# Patient Record
Sex: Male | Born: 1945 | Race: White | Hispanic: No | Marital: Single | Smoking: Current every day smoker
Health system: Southern US, Community
[De-identification: ages and names within clinical notes are randomized; demographics above are authoritative.]

## PROBLEM LIST (undated history)

## (undated) DIAGNOSIS — I10 Essential (primary) hypertension: Secondary | ICD-10-CM

## (undated) DIAGNOSIS — E785 Hyperlipidemia, unspecified: Secondary | ICD-10-CM

## (undated) DIAGNOSIS — E119 Type 2 diabetes mellitus without complications: Secondary | ICD-10-CM

## (undated) DIAGNOSIS — I2699 Other pulmonary embolism without acute cor pulmonale: Secondary | ICD-10-CM

## (undated) HISTORY — PX: BYPASS GRAFT: SHX909

---

## 2000-01-24 ENCOUNTER — Encounter: Admission: RE | Admit: 2000-01-24 | Discharge: 2000-01-24 | Payer: Self-pay | Admitting: Family Medicine

## 2000-01-24 ENCOUNTER — Encounter: Payer: Self-pay | Admitting: Family Medicine

## 2001-06-21 ENCOUNTER — Emergency Department (HOSPITAL_COMMUNITY): Admission: EM | Admit: 2001-06-21 | Discharge: 2001-06-21 | Payer: Self-pay | Admitting: Emergency Medicine

## 2001-06-21 ENCOUNTER — Encounter: Payer: Self-pay | Admitting: Emergency Medicine

## 2002-09-26 ENCOUNTER — Encounter: Payer: Self-pay | Admitting: Emergency Medicine

## 2002-09-26 ENCOUNTER — Emergency Department (HOSPITAL_COMMUNITY): Admission: EM | Admit: 2002-09-26 | Discharge: 2002-09-26 | Payer: Self-pay | Admitting: Emergency Medicine

## 2002-10-01 ENCOUNTER — Encounter: Payer: Self-pay | Admitting: Urology

## 2002-10-01 ENCOUNTER — Ambulatory Visit (HOSPITAL_COMMUNITY): Admission: RE | Admit: 2002-10-01 | Discharge: 2002-10-01 | Payer: Self-pay | Admitting: Urology

## 2002-10-13 ENCOUNTER — Encounter: Payer: Self-pay | Admitting: Urology

## 2002-10-13 ENCOUNTER — Ambulatory Visit (HOSPITAL_COMMUNITY): Admission: RE | Admit: 2002-10-13 | Discharge: 2002-10-13 | Payer: Self-pay | Admitting: Urology

## 2002-10-14 ENCOUNTER — Ambulatory Visit (HOSPITAL_COMMUNITY): Admission: RE | Admit: 2002-10-14 | Discharge: 2002-10-14 | Payer: Self-pay | Admitting: *Deleted

## 2002-10-14 ENCOUNTER — Encounter: Payer: Self-pay | Admitting: Urology

## 2002-11-01 ENCOUNTER — Ambulatory Visit (HOSPITAL_COMMUNITY): Admission: RE | Admit: 2002-11-01 | Discharge: 2002-11-01 | Payer: Self-pay | Admitting: Gastroenterology

## 2002-11-19 ENCOUNTER — Ambulatory Visit (HOSPITAL_COMMUNITY): Admission: RE | Admit: 2002-11-19 | Discharge: 2002-11-19 | Payer: Self-pay | Admitting: Urology

## 2002-11-19 ENCOUNTER — Encounter: Payer: Self-pay | Admitting: Urology

## 2005-09-13 ENCOUNTER — Ambulatory Visit (HOSPITAL_COMMUNITY): Admission: RE | Admit: 2005-09-13 | Discharge: 2005-09-13 | Payer: Self-pay | Admitting: Gastroenterology

## 2011-02-21 ENCOUNTER — Encounter (HOSPITAL_COMMUNITY)
Admission: RE | Admit: 2011-02-21 | Discharge: 2011-02-21 | Disposition: A | Discharge status: Home / Self Care | Payer: Medicare Other | Source: Ambulatory Visit | Attending: Internal Medicine | Admitting: Internal Medicine

## 2011-02-21 DIAGNOSIS — Z5189 Encounter for other specified aftercare: Secondary | ICD-10-CM | POA: Insufficient documentation

## 2011-02-22 NOTE — Progress Notes (Signed)
During orientation advised patient on arrival and appointment times what to wear, what to do before, during and after exercise. Reviewed attendance and class policy. Talked about inclement weather and class consultation policy. Pt is scheduled to start Cardiac Rehab on 02/25/11 at 11 am. Pt was advised to come to class 5 minutes before class starts. He was also given instructions on meeting with the dietician and attending the Family Structure classes. Pt is eager to get started.

## 2011-02-22 NOTE — Patient Instructions (Signed)
Pt has finished orientation and is scheduled to start CR on 02/25/11 at 11 am. Pt has been instructed to arrive to class 15 minutes early for scheduled class. Pt has been instructed to wear comfortable clothing and shoes with rubber soles. Pt has been told to take their medications 1 hour prior to coming to class.  If the patient is not going to attend class, he/she has been instructed to call.

## 2011-02-25 ENCOUNTER — Encounter (HOSPITAL_COMMUNITY)
Admission: RE | Admit: 2011-02-25 | Discharge: 2011-02-25 | Disposition: A | Discharge status: Home / Self Care | Payer: Medicare Other | Source: Ambulatory Visit | Attending: Internal Medicine | Admitting: Internal Medicine

## 2011-02-27 ENCOUNTER — Encounter (HOSPITAL_COMMUNITY)
Admission: RE | Admit: 2011-02-27 | Discharge: 2011-02-27 | Disposition: A | Discharge status: Home / Self Care | Payer: Medicare Other | Source: Ambulatory Visit | Attending: Internal Medicine | Admitting: Internal Medicine

## 2011-03-01 ENCOUNTER — Encounter (HOSPITAL_COMMUNITY)
Admission: RE | Admit: 2011-03-01 | Discharge: 2011-03-01 | Disposition: A | Discharge status: Home / Self Care | Payer: Medicare Other | Source: Ambulatory Visit | Attending: Internal Medicine | Admitting: Internal Medicine

## 2011-03-04 ENCOUNTER — Encounter (HOSPITAL_COMMUNITY): Payer: Medicare Other

## 2011-03-06 ENCOUNTER — Encounter (HOSPITAL_COMMUNITY)
Admission: RE | Admit: 2011-03-06 | Discharge: 2011-03-06 | Disposition: A | Discharge status: Home / Self Care | Payer: Medicare Other | Source: Ambulatory Visit | Attending: Internal Medicine | Admitting: Internal Medicine

## 2011-03-08 ENCOUNTER — Encounter (HOSPITAL_COMMUNITY)
Admission: RE | Admit: 2011-03-08 | Discharge: 2011-03-08 | Disposition: A | Discharge status: Home / Self Care | Payer: Medicare Other | Source: Ambulatory Visit | Attending: Internal Medicine | Admitting: Internal Medicine

## 2011-03-11 ENCOUNTER — Encounter (HOSPITAL_COMMUNITY): Payer: Medicare Other

## 2011-03-13 ENCOUNTER — Encounter (HOSPITAL_COMMUNITY)
Admission: RE | Admit: 2011-03-13 | Discharge: 2011-03-13 | Disposition: A | Discharge status: Home / Self Care | Payer: Medicare Other | Source: Ambulatory Visit | Attending: Internal Medicine | Admitting: Internal Medicine

## 2011-03-15 ENCOUNTER — Encounter (HOSPITAL_COMMUNITY): Payer: Medicare Other

## 2011-03-18 ENCOUNTER — Encounter (HOSPITAL_COMMUNITY)
Admission: RE | Admit: 2011-03-18 | Discharge: 2011-03-18 | Disposition: A | Discharge status: Home / Self Care | Payer: Medicare Other | Source: Ambulatory Visit | Attending: Internal Medicine | Admitting: Internal Medicine

## 2011-03-20 ENCOUNTER — Encounter (HOSPITAL_COMMUNITY)
Admission: RE | Admit: 2011-03-20 | Discharge: 2011-03-20 | Disposition: A | Discharge status: Home / Self Care | Payer: Medicare Other | Source: Ambulatory Visit | Attending: Internal Medicine | Admitting: Internal Medicine

## 2011-03-22 ENCOUNTER — Encounter (HOSPITAL_COMMUNITY): Payer: Medicare Other

## 2011-03-25 ENCOUNTER — Encounter (HOSPITAL_COMMUNITY)
Admission: RE | Admit: 2011-03-25 | Discharge: 2011-03-25 | Disposition: A | Discharge status: Home / Self Care | Payer: Medicare Other | Source: Ambulatory Visit | Attending: Internal Medicine | Admitting: Internal Medicine

## 2011-03-27 ENCOUNTER — Encounter (HOSPITAL_COMMUNITY)
Admission: RE | Admit: 2011-03-27 | Discharge: 2011-03-27 | Disposition: A | Discharge status: Home / Self Care | Payer: Medicare Other | Source: Ambulatory Visit | Attending: Internal Medicine | Admitting: Internal Medicine

## 2011-03-29 ENCOUNTER — Encounter (HOSPITAL_COMMUNITY): Payer: Medicare Other

## 2011-03-29 NOTE — Progress Notes (Signed)
Cardiac Rehabilitation Program Progress Report   Orientation:  02/21/2011 Graduate Date:  tbd Discharge Date:  tbd # of sessions completed: 3  Cardiologist: Daryel November Family MD:  Dr Keturah Shavers Time:  11:00  A.  Exercise Program:  Tolerates exercise @ 2.4 METS for 15 minutes  B.  Mental Health:  Good mental attitude  C.  Education/Instruction/Skills  Knows THR for exercise and Uses Perceived Exertion Scale and/or Dyspnea Scale  Uses Perceived Exertion Scale and/or Dyspnea Scale  D.  Nutrition/Weight Control/Body Composition:  Adherence to prescribed nutrition program: good   *This section completed by Mickle Plumb, Andres Shad, RD, LDN, CDE  E.  Blood Lipids    No results found for this basename: CHOL     No results found for this basename: TRIG     No results found for this basename: HDL     No results found for this basename: CHOLHDL     No results found for this basename: LDLDIRECT      F.  Lifestyle Changes:  Making positive lifestyle changes  G.  Symptoms noted with exercise:  Asymptomatic  Report Completed By:  Angelica Pou   Comments: This is patients 1st week report. He achieved a peak mets of 2.4. His resting HR is 87 and his resting BP is 122/60. His peak HR is 111 and his peak BP is 138/70. He is motivated to exercise. A halfway report will follow.

## 2011-04-01 ENCOUNTER — Encounter (HOSPITAL_COMMUNITY)
Admission: RE | Admit: 2011-04-01 | Discharge: 2011-04-01 | Disposition: A | Discharge status: Home / Self Care | Payer: Medicare Other | Source: Ambulatory Visit | Attending: Internal Medicine | Admitting: Internal Medicine

## 2011-04-03 ENCOUNTER — Encounter (HOSPITAL_COMMUNITY)
Admission: RE | Admit: 2011-04-03 | Discharge: 2011-04-03 | Disposition: A | Discharge status: Home / Self Care | Payer: Medicare Other | Source: Ambulatory Visit | Attending: Internal Medicine | Admitting: Internal Medicine

## 2011-04-05 ENCOUNTER — Encounter (HOSPITAL_COMMUNITY): Payer: Medicare Other

## 2011-04-08 ENCOUNTER — Encounter (HOSPITAL_COMMUNITY)
Admission: RE | Admit: 2011-04-08 | Discharge: 2011-04-08 | Disposition: A | Discharge status: Home / Self Care | Payer: Medicare Other | Source: Ambulatory Visit | Attending: Internal Medicine | Admitting: Internal Medicine

## 2011-04-10 ENCOUNTER — Encounter (HOSPITAL_COMMUNITY)
Admission: RE | Admit: 2011-04-10 | Discharge: 2011-04-10 | Disposition: A | Discharge status: Home / Self Care | Payer: Medicare Other | Source: Ambulatory Visit | Attending: Internal Medicine | Admitting: Internal Medicine

## 2011-04-12 ENCOUNTER — Encounter (HOSPITAL_COMMUNITY)
Admission: RE | Admit: 2011-04-12 | Discharge: 2011-04-12 | Disposition: A | Discharge status: Home / Self Care | Payer: Medicare Other | Source: Ambulatory Visit | Attending: Internal Medicine | Admitting: Internal Medicine

## 2011-04-12 DIAGNOSIS — Z951 Presence of aortocoronary bypass graft: Secondary | ICD-10-CM | POA: Insufficient documentation

## 2011-04-12 DIAGNOSIS — Z5189 Encounter for other specified aftercare: Secondary | ICD-10-CM | POA: Insufficient documentation

## 2011-04-12 DIAGNOSIS — I2510 Atherosclerotic heart disease of native coronary artery without angina pectoris: Secondary | ICD-10-CM | POA: Insufficient documentation

## 2011-04-15 ENCOUNTER — Encounter (HOSPITAL_COMMUNITY)
Admission: RE | Admit: 2011-04-15 | Discharge: 2011-04-15 | Disposition: A | Discharge status: Home / Self Care | Payer: Medicare Other | Source: Ambulatory Visit | Attending: Internal Medicine | Admitting: Internal Medicine

## 2011-04-17 ENCOUNTER — Encounter (HOSPITAL_COMMUNITY)
Admission: RE | Admit: 2011-04-17 | Discharge: 2011-04-17 | Disposition: A | Discharge status: Home / Self Care | Payer: Medicare Other | Source: Ambulatory Visit | Attending: Internal Medicine | Admitting: Internal Medicine

## 2011-04-19 ENCOUNTER — Encounter (HOSPITAL_COMMUNITY): Payer: Medicare Other

## 2011-04-22 ENCOUNTER — Encounter (HOSPITAL_COMMUNITY)
Admission: RE | Admit: 2011-04-22 | Discharge: 2011-04-22 | Disposition: A | Discharge status: Home / Self Care | Payer: Medicare Other | Source: Ambulatory Visit | Attending: Internal Medicine | Admitting: Internal Medicine

## 2011-04-22 NOTE — Progress Notes (Signed)
Cardiac Rehabilitation Program Progress Report   Orientation:  02/21/2011 Graduate Date:  tbd Discharge Date:  tbd # of sessions completed: 18  Cardiologist: Daryel November Family MD:  Dr. Keturah Shavers Time:  09:30  A.  Exercise Program:  Tolerates exercise @ 3.3 METS for 15 minutes  B.  Mental Health:  Good mental attitude  C.  Education/Instruction/Skills  Knows THR for exercise and Uses Perceived Exertion Scale and/or Dyspnea Scale  Uses Perceived Exertion Scale and/or Dyspnea Scale  D.  Nutrition/Weight Control/Body Composition:  Adherence to prescribed nutrition program: good   *This section completed by Mickle Plumb, Andres Shad, RD, LDN, CDE  E.  Blood Lipids    No results found for this basename: CHOL     No results found for this basename: TRIG     No results found for this basename: HDL     No results found for this basename: CHOLHDL     No results found for this basename: LDLDIRECT      F.  Lifestyle Changes:  Making positive lifestyle changes  G.  Symptoms noted with exercise:  Asymptomatic  Report Completed By:  Angelica Pou   Comments:  This is patients halfway report . He has achieved a peak mets of 3.3. His resting HR is 91 and his resting BP is 108/62, His peak HR is 125 and his peak BP is 110/62. He is very motivated to exercise. A Graduation report will follow at his 36th session.

## 2011-04-24 ENCOUNTER — Encounter (HOSPITAL_COMMUNITY)
Admission: RE | Admit: 2011-04-24 | Discharge: 2011-04-24 | Disposition: A | Discharge status: Home / Self Care | Payer: Medicare Other | Source: Ambulatory Visit | Attending: Internal Medicine | Admitting: Internal Medicine

## 2011-04-26 ENCOUNTER — Encounter (HOSPITAL_COMMUNITY)
Admission: RE | Admit: 2011-04-26 | Discharge: 2011-04-26 | Disposition: A | Discharge status: Home / Self Care | Payer: Medicare Other | Source: Ambulatory Visit | Attending: Internal Medicine | Admitting: Internal Medicine

## 2011-04-29 ENCOUNTER — Encounter (HOSPITAL_COMMUNITY)
Admission: RE | Admit: 2011-04-29 | Discharge: 2011-04-29 | Disposition: A | Discharge status: Home / Self Care | Payer: Medicare Other | Source: Ambulatory Visit | Attending: Internal Medicine | Admitting: Internal Medicine

## 2011-05-01 ENCOUNTER — Encounter (HOSPITAL_COMMUNITY)
Admission: RE | Admit: 2011-05-01 | Discharge: 2011-05-01 | Disposition: A | Discharge status: Home / Self Care | Payer: Medicare Other | Source: Ambulatory Visit | Attending: Internal Medicine | Admitting: Internal Medicine

## 2011-05-03 ENCOUNTER — Encounter (HOSPITAL_COMMUNITY)
Admission: RE | Admit: 2011-05-03 | Discharge: 2011-05-03 | Disposition: A | Discharge status: Home / Self Care | Payer: Medicare Other | Source: Ambulatory Visit | Attending: Internal Medicine | Admitting: Internal Medicine

## 2011-05-06 ENCOUNTER — Encounter (HOSPITAL_COMMUNITY)
Admission: RE | Admit: 2011-05-06 | Discharge: 2011-05-06 | Disposition: A | Discharge status: Home / Self Care | Payer: Medicare Other | Source: Ambulatory Visit | Attending: Internal Medicine | Admitting: Internal Medicine

## 2011-05-08 ENCOUNTER — Encounter (HOSPITAL_COMMUNITY)
Admission: RE | Admit: 2011-05-08 | Discharge: 2011-05-08 | Disposition: A | Discharge status: Home / Self Care | Payer: Medicare Other | Source: Ambulatory Visit | Attending: Internal Medicine | Admitting: Internal Medicine

## 2011-05-10 ENCOUNTER — Encounter (HOSPITAL_COMMUNITY)
Admission: RE | Admit: 2011-05-10 | Discharge: 2011-05-10 | Disposition: A | Discharge status: Home / Self Care | Payer: Medicare Other | Source: Ambulatory Visit | Attending: Internal Medicine | Admitting: Internal Medicine

## 2011-05-10 DIAGNOSIS — I2510 Atherosclerotic heart disease of native coronary artery without angina pectoris: Secondary | ICD-10-CM | POA: Insufficient documentation

## 2011-05-10 DIAGNOSIS — Z951 Presence of aortocoronary bypass graft: Secondary | ICD-10-CM | POA: Insufficient documentation

## 2011-05-10 DIAGNOSIS — Z5189 Encounter for other specified aftercare: Secondary | ICD-10-CM | POA: Insufficient documentation

## 2011-05-13 ENCOUNTER — Encounter (HOSPITAL_COMMUNITY)
Admission: RE | Admit: 2011-05-13 | Discharge: 2011-05-13 | Disposition: A | Discharge status: Home / Self Care | Payer: Medicare Other | Source: Ambulatory Visit | Attending: Internal Medicine | Admitting: Internal Medicine

## 2011-05-15 ENCOUNTER — Encounter (HOSPITAL_COMMUNITY)
Admission: RE | Admit: 2011-05-15 | Discharge: 2011-05-15 | Disposition: A | Discharge status: Home / Self Care | Payer: Medicare Other | Source: Ambulatory Visit | Attending: Internal Medicine | Admitting: Internal Medicine

## 2011-05-17 ENCOUNTER — Encounter (HOSPITAL_COMMUNITY)
Admission: RE | Admit: 2011-05-17 | Discharge: 2011-05-17 | Disposition: A | Discharge status: Home / Self Care | Payer: Medicare Other | Source: Ambulatory Visit | Attending: Internal Medicine | Admitting: Internal Medicine

## 2011-05-20 ENCOUNTER — Encounter (HOSPITAL_COMMUNITY): Payer: Medicare Other

## 2011-05-22 ENCOUNTER — Encounter (HOSPITAL_COMMUNITY)
Admission: RE | Admit: 2011-05-22 | Discharge: 2011-05-22 | Disposition: A | Discharge status: Home / Self Care | Payer: Medicare Other | Source: Ambulatory Visit | Attending: Internal Medicine | Admitting: Internal Medicine

## 2011-05-24 ENCOUNTER — Encounter (HOSPITAL_COMMUNITY)
Admission: RE | Admit: 2011-05-24 | Discharge: 2011-05-24 | Disposition: A | Discharge status: Home / Self Care | Payer: Medicare Other | Source: Ambulatory Visit | Attending: Internal Medicine | Admitting: Internal Medicine

## 2011-05-24 DIAGNOSIS — I25810 Atherosclerosis of coronary artery bypass graft(s) without angina pectoris: Secondary | ICD-10-CM

## 2011-05-27 ENCOUNTER — Encounter (HOSPITAL_COMMUNITY)
Admission: RE | Admit: 2011-05-27 | Discharge: 2011-05-27 | Disposition: A | Discharge status: Home / Self Care | Payer: Medicare Other | Source: Ambulatory Visit | Attending: Internal Medicine | Admitting: Internal Medicine

## 2011-05-29 ENCOUNTER — Encounter (HOSPITAL_COMMUNITY)
Admission: RE | Admit: 2011-05-29 | Discharge: 2011-05-29 | Disposition: A | Discharge status: Home / Self Care | Payer: Medicare Other | Source: Ambulatory Visit | Attending: Internal Medicine | Admitting: Internal Medicine

## 2011-05-31 ENCOUNTER — Encounter (HOSPITAL_COMMUNITY)
Admission: RE | Admit: 2011-05-31 | Discharge: 2011-05-31 | Disposition: A | Discharge status: Home / Self Care | Payer: Medicare Other | Source: Ambulatory Visit | Attending: Internal Medicine | Admitting: Internal Medicine

## 2011-06-03 ENCOUNTER — Encounter (HOSPITAL_COMMUNITY)
Admission: RE | Admit: 2011-06-03 | Discharge: 2011-06-03 | Disposition: A | Discharge status: Home / Self Care | Payer: Medicare Other | Source: Ambulatory Visit | Attending: Internal Medicine | Admitting: Internal Medicine

## 2011-06-05 NOTE — Progress Notes (Signed)
Cardiac Rehabilitation Program Outcomes Report   Orientation:  02/21/11 Graduate Date:  06/05/2011 Discharge Date:  06/05/2011 # of sessions completed: 36  Cardiologist: Daryel November Family MD:  Dr. Burnette([place]) Class Time:  11:00  A.  Exercise Program:  Tolerates exercise @ 3.1` METS for 15 minutes and Discharged to home exercise program.  Anticipated compliance:  excellent  B.  Mental Health:  Good mental attitude  C.  Education/Instruction/Skills  Knows THR for exercise, Uses Perceived Exertion Scale and/or Dyspnea Scale and Attended all education classes  Uses Perceived Exertion Scale and/or Dyspnea Scale  D.  Nutrition/Weight Control/Body Composition:  Adherence to prescribed nutrition program: good    E.  Blood Lipids    No results found for this basename: CHOL, HDL, LDLCALC, LDLDIRECT, TRIG, CHOLHDL    F.  Lifestyle Changes:  Making positive lifestyle changes  G.  Symptoms noted with exercise:  Asymptomatic  Report Completed By:  Lelon Huh. Tika Hannis RN  Comments:  Mr Cravens has progressed nicely to 30 min off aerobic exercise @ Max METS level of 3.1.miles and 10 min of strength and flexibility exercises. All patients vital signs are normal. Patient has met with dietician. DC instructions have been discussed in detail and patient verbalized understanding. Patient plans to join a local gym for follow up exercise. Cardiac rehab staff will be contacting patients at 1 month, 6 month and at 1 year for follow-up Patient had No C/O pain or any other abnormal symptoms while in rehab.

## 2011-06-07 ENCOUNTER — Encounter (HOSPITAL_COMMUNITY)
Admission: RE | Admit: 2011-06-07 | Discharge: 2011-06-07 | Disposition: A | Discharge status: Home / Self Care | Payer: Medicare Other | Source: Ambulatory Visit | Attending: Internal Medicine | Admitting: Internal Medicine

## 2011-06-07 ENCOUNTER — Ambulatory Visit (HOSPITAL_COMMUNITY): Payer: BC Managed Care – PPO

## 2012-02-04 ENCOUNTER — Ambulatory Visit (HOSPITAL_COMMUNITY)
Admission: RE | Admit: 2012-02-04 | Discharge: 2012-02-04 | Disposition: A | Discharge status: Home / Self Care | Payer: Non-veteran care | Source: Ambulatory Visit | Attending: Nurse Practitioner | Admitting: Nurse Practitioner

## 2012-02-04 DIAGNOSIS — M549 Dorsalgia, unspecified: Secondary | ICD-10-CM | POA: Insufficient documentation

## 2012-02-04 DIAGNOSIS — M2560 Stiffness of unspecified joint, not elsewhere classified: Secondary | ICD-10-CM | POA: Insufficient documentation

## 2012-02-04 DIAGNOSIS — R262 Difficulty in walking, not elsewhere classified: Secondary | ICD-10-CM | POA: Insufficient documentation

## 2012-02-04 DIAGNOSIS — IMO0001 Reserved for inherently not codable concepts without codable children: Secondary | ICD-10-CM | POA: Insufficient documentation

## 2012-02-04 NOTE — Evaluation (Signed)
Physical Therapy Evaluation  Patient Details  Name: Alex Salazar MRN: [id number] Date of Birth: [birth year]  Today's Date: 02/04/2012 Time: 1107-1209 PT Time Calculation (min): 62 min  Visit#: 1  of 11   Re-eval: 03/05/12 Assessment Diagnosis: fatigue, back/leg pain Surgical Date: 01/09/11  [facility]ization: [facility]  Authorization Time Period: 01/21/12 - 04/20/2012  Authorization Visit#: 1  of 11    Past Medical History: No past medical history on file. Past Surgical History: No past surgical history on file.  Subjective Symptoms/Limitations Symptoms: Alex Salazar states that if he stands at the kitchen sink for more than five minutes he has pain on the right side of his back.  If he does not sit down the pain will go across to the L side of his back.  He states that both of his legs hurt but he  has been told that this is due to neuropathy.  The patient states he periodically has pain that radiate down into his left leg.  Sometimes the pain only goes to the upper thigh other times is goes down as far as his foot.  He had B blood clots in July.  His main concern at this tme is his fatigue.  He states he tries to walk to combat the fatigue but then his back and legs start to hurt so he quits.  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 How long can you sit comfortably?: sitting is no problem How long can you stand comfortably?: less than five minutes How long can you walk comfortably?: less than five minutes. Pain Assessment Currently in Pain?: No/denies Pain Score: 0-No pain (leg pain 10/10; back pain will get to a 7/10) Pain Location: Back Pain Orientation: Lower;Right;Left Pain Type: Chronic pain Pain Radiating Towards: L leg  Pain Onset: More than a month ago Pain Relieving Factors: rest Effect of Pain on Daily Activities:  activitiy]       Assessment RLE Strength Right Hip Flexion: 5/5 Right Hip Extension: 5/5 Right Hip ABduction: 5/5 Right Hip ADduction: 5/5 Right Knee Flexion: 5/5 Right Knee Extension: 5/5 Right Ankle Dorsiflexion: 4/5 LLE Strength Left Hip Flexion: 5/5 Left Hip Extension: 5/5 Left Hip ABduction: 5/5 Left Hip ADduction: 5/5 Left Knee Flexion: 5/5 Left Knee Extension: 5/5 Left Ankle Dorsiflexion:  (4+/5) Lumbar AROM Lumbar Flexion: decreased 20% reps no change. Lumbar Extension: decreased 80% reps no change Lumbar - Right Side Bend: decreased 60%. Lumbar - Left Side Bend: decreased 50% no change  Lumbar - Right Rotation: decreased 25% Lumbar - Left Rotation: decreased 50%  Exercise/Treatments     Stretches Active Hamstring Stretch: 3 reps;30 seconds Single Knee to Chest Stretch: 3 reps;30 seconds Lower Trunk Rotation: 5 reps Standing Side Bend: 5 reps Standing Other Standing Lumbar Exercises: R/L SB x 5 Seated Other Seated Lumbar Exercises: Toe/heel squeezes x 10 Supine Ab Set: 5 reps Other Supine Lumbar Exercises: pelvic floor x 10     Physical Therapy Assessment and Plan PT Assessment and Plan Clinical Impression Statement: Pt with decreased core strength, stiffness of thoracic and lumbar mm who c/o of LBP and L radicular pain who will benefit from skilled PT to improve pain, function and quality of life.  Pt will benefit from skilled therapeutic intervention in order to improve on the following deficits: Abnormal gait;Decreased activity tolerance;Pain;Impaired flexibility;Increased fascial restricitons;Decreased mobility Rehab Potential: Good PT  Frequency: Min 2X/week PT Duration:  (5 weeks) PT Treatment/Interventions: Gait training;Therapeutic activities;Modalities;Patient/family education PT Plan: Pt may begin pelvic traction for stretching and reducing radicular sx; begin gait trainer to normalize gt; add clam, bridge, bend knee raise and SLR for improved  stabilzation    Goals Home Exercise Program Pt will Perform Home Exercise Program: Independently PT Short Term Goals Time to Complete Short Term Goals: 2 weeks PT Short Term Goal 1: Pain no greater than a 5 80% of the day PT Short Term Goal 2: Pt to be able to walk for 15 minutes without increased pain PT Short Term Goal 3: Pt ROM improved by 25% PT Long Term Goals Time to Complete Long Term Goals: 4 weeks PT Long Term Goal 1: Pt to states L radicular pain is not going past his knee PT Long Term Goal 2: Pt pain to be no greater than a 3 80% of the day PT Long Term Goal 2 - Progress: Progressing toward goal Long Term Goal 3: Pt ROM improved 50% to allow pt to pick an item off the floor Long Term Goal 4: PT to be able to walk for 30 minutes without pain.  Problem List Patient Active Problem List  Diagnosis  . Difficulty in walking  . Stiffness of joints, not elsewhere classified, multiple sites    PT - End of Session Activity Tolerance: Patient tolerated treatment well General Behavior During Session: [facility] for tasks performed Cognition: [facility] for tasks performed PT Plan of Care PT Home Exercise Plan: given Consulted and Agree with Plan of Care: Patient  GP    RUSSELL,CINDY 02/04/2012, 1:55 PM  Physician Documentation Your signature is required to indicate approval of the treatment plan as stated above.  Please sign and either send electronically or make a copy of this report for your files and return this physician signed original.   Please mark one 1.__approve of plan  2. ___approve of plan with the following conditions.   ______________________________                                                          _____________________ Physician Signature                                                                                                             Date

## 2012-02-11 ENCOUNTER — Ambulatory Visit (HOSPITAL_COMMUNITY)
Admission: RE | Admit: 2012-02-11 | Discharge: 2012-02-11 | Disposition: A | Discharge status: Home / Self Care | Payer: Non-veteran care | Source: Ambulatory Visit | Attending: Nurse Practitioner | Admitting: Nurse Practitioner

## 2012-02-11 DIAGNOSIS — M549 Dorsalgia, unspecified: Secondary | ICD-10-CM | POA: Insufficient documentation

## 2012-02-11 DIAGNOSIS — R262 Difficulty in walking, not elsewhere classified: Secondary | ICD-10-CM | POA: Insufficient documentation

## 2012-02-11 DIAGNOSIS — IMO0001 Reserved for inherently not codable concepts without codable children: Secondary | ICD-10-CM | POA: Insufficient documentation

## 2012-02-11 NOTE — Progress Notes (Signed)
Physical Therapy Treatment Patient Details  Name: Alex Salazar MRN: [id number] Date of Birth: 02-15-1946  Today's Date: 02/11/2012 Time: 0930-1030 PT Time Calculation (min): 60 min  Visit#: 2  of 11   Re-eval: 03/05/12 Charges: Therex x 25' Mechanical traction x 17'  Authorization: [facility]  Authorization Time Period: 01/21/12 - 04/20/2012  Authorization Visit#: 2  of 11    Subjective: Symptoms/Limitations Symptoms: Pt states that he has been doing the exercises that he could remember at home. He lost his sheet. Pain Assessment Currently in Pain?: Yes Pain Score: 10-Worst pain ever Pain Location: Leg Pain Orientation: Left;Anterior Pain Radiating Towards: L foot   Exercise/Treatments Stretches Passive Hamstring Stretch: 3 reps;30 seconds;Limitations Passive Hamstring Stretch Limitations: with rope Single Knee to Chest Stretch: 3 reps;30 seconds Lower Trunk Rotation: 5 reps Supine Ab Set: 10 reps;5 seconds Bent Knee Raise: 10 reps Bridge: 10 reps;5 seconds Straight Leg Raise: 10 reps  Modalities Modalities: Traction Traction Type of Traction: Lumbar Max (lbs): 80# Hold Time: Static Time: 36' (17')  Physical Therapy Assessment and Plan PT Assessment and Plan Clinical Impression Statement: Pt requires multimodal cueing to facilitated core control with supine stabilization exercises. Modified active HS stretch to passive with rope as pt was unable to feel stretch secondary to quad weakness. Began mechanical lumbar tx to decrease radicular sx. Pt reports no radicular sx at end of session. PT Duration:  (5 weeks) PT Plan: Begin gait trainer to normalize gt next session.     Problem List Patient Active Problem List  Diagnosis  . Difficulty in walking  . Stiffness of joints, not elsewhere classified, multiple sites    PT - End of Session Activity Tolerance: Patient tolerated treatment well General Behavior During Session: [facility] for tasks performed Cognition: [facility]  for tasks performed  Seth Bake, PTA 02/11/2012, 11:58 AM

## 2012-02-13 ENCOUNTER — Ambulatory Visit (HOSPITAL_COMMUNITY)
Admission: RE | Admit: 2012-02-13 | Discharge: 2012-02-13 | Disposition: A | Discharge status: Home / Self Care | Payer: Non-veteran care | Source: Ambulatory Visit | Attending: *Deleted | Admitting: *Deleted

## 2012-02-13 DIAGNOSIS — R262 Difficulty in walking, not elsewhere classified: Secondary | ICD-10-CM

## 2012-02-13 DIAGNOSIS — M2560 Stiffness of unspecified joint, not elsewhere classified: Secondary | ICD-10-CM

## 2012-02-13 NOTE — Progress Notes (Signed)
Physical Therapy Treatment Patient Details  Name: Alex Salazar MRN: [id number] Date of Birth: 1945/11/11  Today's Date: 02/13/2012 Time: 1026-1118 PT Time Calculation (min): 52 min  Visit#: 3  of 11   Re-eval: 03/05/12    Authorization: [facility]    Authorization Visit#: 3  of 11    Subjective: Symptoms/Limitations Symptoms: Pt states that he has not had a bad day since using the traction  Pain Assessment Currently in Pain?: Yes Pain Score:   1 Pain Location: Back Pain Orientation: Left    Exercise/Treatments      Stretches Active Hamstring Stretch: 3 reps;30 seconds Single Knee to Chest Stretch: 3 reps;30 seconds Lower Trunk Rotation: 5 reps Aerobic Tread Mill: Gt trainer LL 92 cm to normalize gait    Standing Other Standing Lumbar Exercises: heel and back against wall; head on towel B UE flex x 10   Supine Ab Set: 10 reps Bent Knee Raise: 10 reps Bridge: 10 reps Straight Leg Raise: 10 reps  Modalities Modalities: Traction Traction Type of Traction: Lumbar Max (lbs): 80#-static  Physical Therapy Assessment and Plan PT Assessment and Plan Clinical Impression Statement: Pt continues to need verbal cuing for proper stabilization.  Pt did well with traction.   PT Plan: begin prone exercises next visit.    Goals    Problem List Patient Active Problem List  Diagnosis  . Difficulty in walking  . Stiffness of joints, not elsewhere classified, multiple sites       GP    Di Jasmer,CINDY 02/13/2012, 12:42 PM

## 2012-02-18 ENCOUNTER — Ambulatory Visit (HOSPITAL_COMMUNITY)
Admission: RE | Admit: 2012-02-18 | Discharge: 2012-02-18 | Disposition: A | Discharge status: Home / Self Care | Payer: Non-veteran care | Source: Ambulatory Visit | Attending: *Deleted | Admitting: *Deleted

## 2012-02-18 DIAGNOSIS — R262 Difficulty in walking, not elsewhere classified: Secondary | ICD-10-CM

## 2012-02-18 DIAGNOSIS — M2560 Stiffness of unspecified joint, not elsewhere classified: Secondary | ICD-10-CM

## 2012-02-18 NOTE — Progress Notes (Signed)
Physical Therapy Treatment Patient Details  Name: Alex Salazar MRN: [id number] Date of Birth: 11/15/1945  Today's Date: 02/18/2012 Time: 1012-1117 PT Time Calculation (min): 65 min  Visit#: 4  of 11   Re-eval: 03/05/12    Authorization: [facility]  Authorization Time Period:    Authorization Visit#: 4  of 11    Subjective: Symptoms/Limitations Symptoms: Pt states that he feels better overall but he is unable to give a percentage to it.  Pt states that he is doing his exercises at home   Exercise/Treatments  Stretches Active Hamstring Stretch: 3 reps;30 seconds Single Knee to Chest Stretch: 3 reps;30 seconds Lower Trunk Rotation: 5 reps Aerobic Tread Mill: Gt trainer LL 90 cm Machines for Strengthening   Standing Scapular Retraction: Strengthening;10 reps;Theraband Theraband Level (Scapular Retraction): Level 3 (Green) Row: Strengthening;10 reps;Theraband Theraband Level (Row): Level 3 (Green) Shoulder Extension: Strengthening;10 reps;Theraband Theraband Level (Shoulder Extension): Level 3 (Green) Other Standing Lumbar Exercises: heel and back against wall; head on towel B UE flex x 10   Supine Bent Knee Raise: 10 reps Bridge: 10 reps Straight Leg Raise: 10 reps Prone  Straight Leg Raise: 10 reps Other Prone Lumbar Exercises: heel squeeze x 10 Quadruped    Modalities Modalities: Traction Traction Type of Traction: Lumbar Max (lbs): 80# static  Physical Therapy Assessment and Plan PT Assessment and Plan Clinical Impression Statement: Pt added postural and prone ex with verbal and manual cuing to keep proper stabilization.  PT Plan: begin standing postural UE flex ex next trreatment    Goals Home Exercise Program PT Goal: Perform Home Exercise Program - Progress: Met PT Short Term Goals PT Short Term Goal 1 - Progress: Progressing toward goal PT Short Term Goal 2 - Progress: Progressing toward goal PT Short Term Goal 3 - Progress: Met PT Long Term  Goals PT Long Term Goal 1 - Progress: Progressing toward goal PT Long Term Goal 2 - Progress: Progressing toward goal Long Term Goal 3 Progress: Progressing toward goal Long Term Goal 4 Progress: Progressing toward goal  Problem List Patient Active Problem List  Diagnosis  . Difficulty in walking  . Stiffness of joints, not elsewhere classified, multiple sites    PT - End of Session Activity Tolerance: Patient tolerated treatment well General Behavior During Session: [facility] for tasks performed Cognition: [facility] for tasks performed  GP    RUSSELL,CINDY 02/18/2012, 11:04 AM

## 2012-02-20 ENCOUNTER — Ambulatory Visit (HOSPITAL_COMMUNITY)
Admission: RE | Admit: 2012-02-20 | Discharge: 2012-02-20 | Disposition: A | Discharge status: Home / Self Care | Payer: Non-veteran care | Source: Ambulatory Visit | Attending: *Deleted | Admitting: *Deleted

## 2012-02-20 NOTE — Progress Notes (Signed)
Physical Therapy Treatment Patient Details  Name: Alex Salazar MRN: [id number] Date of Birth: [birth year]  Today's Date: 02/20/2012 Time: 1022-1124 PT Time Calculation (min): 62 min Visit#: 5  of 11   Re-eval: 03/05/12 Authorization: [facility]  Authorization Visit#: 5  of 11   Charges:  Traction 15', therex 35'  Subjective: Symptoms/Limitations Symptoms: Pt. reports still having some discomfort but overall doing better.     Exercise/Treatments Stretches Active Hamstring Stretch: 3 reps;30 seconds Single Knee to Chest Stretch: 3 reps;30 seconds Lower Trunk Rotation: 5 reps Aerobic Tread Mill: Gt trainer LL 90 cm 10' @ .50cyc/sec Standing Scapular Retraction: Strengthening;10 reps;Theraband Theraband Level (Scapular Retraction): Level 3 (Green) Row: Strengthening;10 reps;Theraband Theraband Level (Row): Level 3 (Green) Shoulder Extension: Strengthening;10 reps;Theraband Theraband Level (Shoulder Extension): Level 3 (Green) Other Standing Lumbar Exercises: heel and back against wall; head on towel B UE flex x 10 Supine Bent Knee Raise: 10 reps Bridge: 10 reps Straight Leg Raise: 10 reps    Modalities Modalities: Traction Traction Type of Traction: Lumbar Max (lbs): 90# static; 4 steps up  Hold Time: static Time: 15'  Physical Therapy Assessment and Plan PT Assessment and Plan Clinical Impression Statement: Pt able to complete all stretches with vc's; improved gait quality/even stride with VC's.  Increased max pull with traction to 90# today without difficulty. PT Plan: Continue per POC.     Problem List Patient Active Problem List  Diagnosis  . Difficulty in walking  . Stiffness of joints, not elsewhere classified, multiple sites    PT - End of Session Activity Tolerance: Patient tolerated treatment well General Behavior During Session: [facility] for tasks performed Cognition: [facility] for tasks performed   Alex Salazar, PTA/CLT 02/20/2012, 11:27 AM

## 2012-02-25 ENCOUNTER — Ambulatory Visit (HOSPITAL_COMMUNITY)
Admission: RE | Admit: 2012-02-25 | Discharge: 2012-02-25 | Disposition: A | Discharge status: Home / Self Care | Payer: Non-veteran care | Source: Ambulatory Visit | Attending: Nurse Practitioner | Admitting: Nurse Practitioner

## 2012-02-25 NOTE — Progress Notes (Signed)
Physical Therapy Treatment Patient Details  Name: Alex Salazar MRN: [id number] Date of Birth: 05/02/1945  Today's Date: 02/25/2012 Time: 1018-1110 PT Time Calculation (min): 52 min  Visit#: 6  of 11   Re-eval: 03/05/12 Charges: Therex x 10' Gait x 9' Mechanical tx x 17' Self care x 10'  Authorization: [facility]  Authorization Visit#: 6  of 11    Subjective: Symptoms/Limitations Symptoms: Pt states that his legs are feeling better. Pain Assessment Currently in Pain?: No/denies   Exercise/Treatments Aerobic Tread Mill: Gt trainer LL 90 cm 9' @ .50cyc/sec (Stopped secondary to B leg pain) Standing Scapular Retraction: Strengthening;10 reps;Theraband Theraband Level (Scapular Retraction): Level 3 (Green) Row: Strengthening;10 reps;Theraband Theraband Level (Row): Level 3 (Green) Shoulder Extension: Strengthening;10 reps;Theraband Theraband Level (Shoulder Extension): Level 3 (Green)   Modalities Modalities: Traction Traction Type of Traction: Lumbar Max (lbs): 90# static; 4 steps up  Hold Time: static Time: 17'  Physical Therapy Assessment and Plan PT Assessment and Plan Clinical Impression Statement: Pt requires multimodal cueing to improve posture with tband exercises and gait training. Pt also requires vc's to improve R step length and heel to toe pattern. Pt educated on cause of radicular sx and how mechanical tx works. Pt continues to have radicular sx when lying supine. These sx were still present but decreased after mechanical traction. PT Plan: Continue to progress per PT POC.     Problem List Patient Active Problem List  Diagnosis  . Difficulty in walking  . Stiffness of joints, not elsewhere classified, multiple sites    PT - End of Session Activity Tolerance: Patient tolerated treatment well General Behavior During Session: [facility] for tasks performed Cognition: [facility] for tasks performed  Seth Bake, PTA 02/25/2012, 11:31 AM

## 2012-02-27 ENCOUNTER — Ambulatory Visit (HOSPITAL_COMMUNITY)
Admission: RE | Admit: 2012-02-27 | Discharge: 2012-02-27 | Disposition: A | Discharge status: Home / Self Care | Payer: Non-veteran care | Source: Ambulatory Visit | Attending: *Deleted | Admitting: *Deleted

## 2012-02-27 DIAGNOSIS — R262 Difficulty in walking, not elsewhere classified: Secondary | ICD-10-CM

## 2012-02-27 DIAGNOSIS — M2560 Stiffness of unspecified joint, not elsewhere classified: Secondary | ICD-10-CM

## 2012-02-27 NOTE — Progress Notes (Signed)
Physical Therapy Treatment Patient Details  Name: Alex Salazar MRN: [id number] Date of Birth: April 20, 1945  Today's Date: 02/27/2012 Time: 1020-1112 PT Time Calculation (min): 52 min  Visit#: 7  of 11   Re-eval: 03/05/12    Authorization: [facility]  Authorization Visit#: 7  of 11    Subjective: Symptoms/Limitations Symptoms: Pt states that his legs give out but not all the time it comes and goes in bouts Pain Assessment Currently in Pain?: Yes Pain Score: 0-No pain   Exercise/Treatments  Stretches Active Hamstring Stretch: 3 reps;30 seconds Single Knee to Chest Stretch: 3 reps;30 seconds Lower Trunk Rotation: 5 reps Aerobic Tread Mill: Gt trainer LL 90 cm 10' @ .50cyc/sec Machines for Strengthening   Standing Scapular Retraction: Strengthening;10 reps;Theraband Theraband Level (Scapular Retraction): Level 3 (Green) Row: Strengthening;10 reps;Theraband Theraband Level (Row): Level 3 (Green) Shoulder Extension: Strengthening;10 reps;Theraband Theraband Level (Shoulder Extension): Level 3 (Green) Other Standing Lumbar Exercises: heel and back against wall; head on towel B UE flex x 10   Prone  Single Arm Raise: 10 reps;Limitations Single Arm Raises Limitations: elbows bent to about  45 degrees Straight Leg Raise: 10 reps Other Prone Lumbar Exercises: hip extension B x 10  Modalities Modalities: Traction Traction Type of Traction: Lumbar Max (lbs): Static 90#  Time: 15  Physical Therapy Assessment and Plan PT Assessment and Plan Clinical Impression Statement: Pt continues to have poor body awaress ie when lying down his hips are significantly offset to his shoulders and he feels that this is normal.  Pt given t-band ex to use at home.  cued on proper posture.  Added new prone exercises. Rehab Potential: Good PT Frequency: Min 2X/week PT Plan: Begin lunging standing activities next treatment.    Goals    Problem List Patient Active Problem List  Diagnosis  .  Difficulty in walking  . Stiffness of joints, not elsewhere classified, multiple sites    PT Plan of Care PT Home Exercise Plan: given  GP    RUSSELL,CINDY 02/27/2012, 12:30 PM

## 2012-03-03 ENCOUNTER — Ambulatory Visit (HOSPITAL_COMMUNITY): Payer: Non-veteran care | Admitting: Physical Therapy

## 2012-03-05 ENCOUNTER — Ambulatory Visit (HOSPITAL_COMMUNITY): Payer: BC Managed Care – PPO | Admitting: Physical Therapy

## 2012-03-10 ENCOUNTER — Ambulatory Visit (HOSPITAL_COMMUNITY): Payer: BC Managed Care – PPO | Admitting: *Deleted

## 2012-03-12 ENCOUNTER — Ambulatory Visit (HOSPITAL_COMMUNITY): Payer: Non-veteran care | Admitting: *Deleted

## 2012-03-12 ENCOUNTER — Ambulatory Visit (HOSPITAL_COMMUNITY)
Admission: RE | Admit: 2012-03-12 | Discharge: 2012-03-12 | Disposition: A | Discharge status: Home / Self Care | Payer: Non-veteran care | Source: Ambulatory Visit | Attending: Family Medicine | Admitting: Family Medicine

## 2012-03-12 DIAGNOSIS — R262 Difficulty in walking, not elsewhere classified: Secondary | ICD-10-CM | POA: Insufficient documentation

## 2012-03-12 DIAGNOSIS — IMO0001 Reserved for inherently not codable concepts without codable children: Secondary | ICD-10-CM | POA: Insufficient documentation

## 2012-03-12 DIAGNOSIS — M549 Dorsalgia, unspecified: Secondary | ICD-10-CM | POA: Insufficient documentation

## 2012-03-12 NOTE — Progress Notes (Signed)
Physical Therapy Treatment Patient Details  Name: Alex Salazar MRN: [id number] Date of Birth: 12/17/1945  Today's Date: 03/12/2012 Time: 1015-1105 PT Time Calculation (min): 50 min  Visit#: 8  of 11   Re-eval: 03/05/12 Charges: Gait x 10' Therex x 15' Mechanical traction x 17'  Authorization: [facility]  Authorization Visit#: 8  of 11    Subjective: Symptoms/Limitations Symptoms: Pt states that traction helps his back pain. Pain Assessment Currently in Pain?: Yes Pain Score:   4 Pain Location: Leg Pain Orientation: Anterior;Proximal;Right;Left   Exercise/Treatments Aerobic Tread Mill: Gt trainer LL 90 cm 10' @ .50cyc/sec Standing Heel Raises: 10 reps;Limitations Heel Raises Limitations: Toe raises x 10 Functional Squats: 10 reps Forward Lunge: 5 reps;Limitations Forward Lunge Limitations: Bilateral Other Standing Lumbar Exercises: Sit to stand x 10 w/o UE assistance  Modalities Modalities: Traction Traction Type of Traction: Lumbar Max (lbs): 90#  Hold Time: Static Time: 51'  Physical Therapy Assessment and Plan PT Assessment and Plan Clinical Impression Statement: Tx focus on improved leg strength and stability. Began functional squats, lunging and STS to improve LE functional strength. Continued mechanical lumbar traction to decrease pain. Traction did not change leg pain.  PT Plan: Continue to progress per PT POC. Assess other options for decreaseing leg pain.     Problem List Patient Active Problem List  Diagnosis  . Difficulty in walking  . Stiffness of joints, not elsewhere classified, multiple sites    PT - End of Session Activity Tolerance: Patient tolerated treatment well General Behavior During Session: [facility] for tasks performed Cognition: [facility] for tasks performed  Seth Bake, PTA 03/12/2012, 11:49 AM

## 2012-03-17 ENCOUNTER — Ambulatory Visit (HOSPITAL_COMMUNITY)
Admission: RE | Admit: 2012-03-17 | Discharge: 2012-03-17 | Disposition: A | Discharge status: Home / Self Care | Payer: Non-veteran care | Source: Ambulatory Visit | Attending: *Deleted | Admitting: *Deleted

## 2012-03-17 NOTE — Progress Notes (Signed)
Physical Therapy Treatment Patient Details  Name: DAYTONA RETANA MRN: [id number] Date of Birth: 1945/10/09  Today's Date: 03/17/2012 Time: 1013-1058 PT Time Calculation (min): 45 min  Visit#: 9  of 11   Re-eval: 03/05/12 Authorization: Medicare  Authorization Visit#: 9  of 11   Charges:  Manual 28', gait 14'  Subjective: Symptoms/Limitations Symptoms: Pt. reports his back feels much better but continues to have pain into his L quad mm; states it is worse with movement or lying with it outstretched.   Pain Assessment Currently in Pain?: Yes Pain Score:   4 Pain Location: Leg Pain Orientation: Left;Anterior Pain Type: Chronic pain   Exercise/Treatments Aerobic Tread Mill: Gt trainer LL 90 cm 10' @ .50cyc/sec  Gait following manual, working on increasing stride/atalgic gait reduction    Manual Therapy Manual Therapy: Other (comment) Myofascial Release: To Right Quad, ITB and hip region Other Manual Therapy: PROM for R LE and hip, focus on quad, ITB, hip flexion, hip IR/ER  Physical Therapy Assessment and Plan PT Assessment and Plan Clinical Impression Statement: Focused tx on decreasing pain/tightness in L LE.  Held TX due to no longer having LBP or related symptoms.  Extreme tightness in L quad, ITB and hip noted with MFR and PROM techniques.   Limited hip IR/ER ROM.  Overall decreased symptoms following manual techniques and improved gait quality following.   PT Plan: Re-eval/ update G-code next visit.     Problem List Patient Active Problem List  Diagnosis  . Difficulty in walking  . Stiffness of joints, not elsewhere classified, multiple sites    PT - End of Session Activity Tolerance: Patient tolerated treatment well General Behavior During Session: [facility] for tasks performed Cognition: [facility] for tasks performed   Lurena Nida, PTA/CLT 03/17/2012, 12:06 PM

## 2012-03-19 ENCOUNTER — Ambulatory Visit (HOSPITAL_COMMUNITY): Payer: Non-veteran care | Admitting: Physical Therapy

## 2012-03-24 ENCOUNTER — Ambulatory Visit (HOSPITAL_COMMUNITY)
Admission: RE | Admit: 2012-03-24 | Discharge: 2012-03-24 | Disposition: A | Discharge status: Home / Self Care | Payer: Non-veteran care | Source: Ambulatory Visit | Attending: *Deleted | Admitting: *Deleted

## 2012-03-24 DIAGNOSIS — R262 Difficulty in walking, not elsewhere classified: Secondary | ICD-10-CM

## 2012-03-24 DIAGNOSIS — M2560 Stiffness of unspecified joint, not elsewhere classified: Secondary | ICD-10-CM

## 2012-03-24 NOTE — Progress Notes (Signed)
Physical Therapy Treatment Patient Details  Name: Alex Salazar MRN: [id number] Date of Birth: 05/02/1945  Today's Date: 03/24/2012 Time: 4098-1191 PT Time Calculation (min): 45 min  Visit#: 10  of 11   Re-eval: 03/26/12    Authorization: [facility] 11 approved   Authorization Visit#: 10  of 11    Subjective: Symptoms/Limitations Symptoms: Pt states he is having most of his pain in his L quad mm; minimal pain in his back   Exercise/Treatments Active Hamstring Stretch: 3 reps;30 seconds Single Knee to Chest Stretch: 3 reps;30 seconds Lower Trunk Rotation: 5 reps Prone on Elbows Stretch: 2 reps;30 seconds Quad Stretch: 3 reps;30 seconds Aerobic Tread Mill: Gt trainer LL 90 x 6:00 Supine Bent Knee Raise: 10 reps Bridge: 10 reps Straight Leg Raise: 5 reps  Prone  Straight Leg Raise: 10 reps Other Prone Lumbar Exercises: B shoulder ext x 10  Manual Therapy Myofascial Release: To  Quad and iliopsoas  Physical Therapy Assessment and Plan PT Assessment and Plan Clinical Impression Statement: Spoke to pt about leg pain actually coming from his back;  Pt states the longer he is up the more his thigh hurts and it will progress to below his knee.  Worked on proper stabilization and ambulating without antalgic gait; Pt may want to consider using a cane to prevent antalgic gtl PT Plan: re eval for D/C next visit.    Goals    Problem List Patient Active Problem List  Diagnosis  . Difficulty in walking  . Stiffness of joints, not elsewhere classified, multiple sites    PT - End of Session Activity Tolerance: Patient tolerated treatment well General Behavior During Session: [facility] for tasks performed Cognition: [facility] for tasks performed  GP    Sandra Tellefsen,CINDY 03/24/2012, 12:18 PM

## 2012-03-26 ENCOUNTER — Ambulatory Visit (HOSPITAL_COMMUNITY)
Admission: RE | Admit: 2012-03-26 | Discharge: 2012-03-26 | Disposition: A | Discharge status: Home / Self Care | Payer: Non-veteran care | Source: Ambulatory Visit | Attending: *Deleted | Admitting: *Deleted

## 2012-03-26 NOTE — Progress Notes (Signed)
Physical Therapy Treatment Patient Details  Name: Alex Salazar MRN: [id number] Date of Birth: Mar 09, 1946  Today's Date: 03/26/2012 Time: 1020-1110 PT Time Calculation (min): 50 min  Visit#: 11  of 11   Re-eval: 03/26/12 Charges: Manual x 20' MMT x 1 ROMM x1  Authorization: [facility] 11 approved  Authorization Time Period:    Authorization Visit#: 11  of 11    Subjective: Symptoms/Limitations Symptoms: Pt only has pain with ambulation. Pain Assessment Currently in Pain?: Yes Pain Score: 0-No pain (Increases to 5/10 with ambulation) Pain Location: Leg Pain Orientation: Left;Anterior  Objective: RLE Strength Right Hip Flexion: 5/5 Right Hip Extension: 5/5 Right Hip ABduction: 5/5 Right Hip ADduction: 5/5 Right Knee Flexion: 5/5 Right Knee Extension: 5/5 Right Ankle Dorsiflexion: 5/5 LLE Strength Left Hip Flexion: 5/5 Left Hip Extension: 5/5 Left Hip ABduction: 5/5 Left Hip ADduction: 5/5 Left Knee Flexion: 5/5 Left Knee Extension: 5/5 Left Ankle Dorsiflexion: 5/5 Lumbar AROM Lumbar Flexion: decreased 10% reps no change. Lumbar Extension: decreased 20% Lumbar - Right Side Bend: decreased 50% Lumbar - Left Side Bend: decreased 50% Lumbar - Right Rotation: decreased 25% Lumbar - Left Rotation: decreased 25%  Exercise/Treatments Stretches Piriformis Stretch: 1 rep;60 seconds Standing Other Standing Lumbar Exercises: Thomas stretch 1x2'  Manual Therapy Other Manual Therapy: PROM for R LE and hip, focus on quad, ITB, hip flexion, hip IR/ER   Physical Therapy Assessment and Plan PT Assessment and Plan Clinical Impression Statement: Pt presents with improved ROM and strength but continues to be limited by pain. Pt states that pain has decreased 50% and he is pleased with his progress with therapy. Pt's flexibility continues to be limited. Hip stretches given for HEP. Pt is comfortable with D/C to HEP. PT Plan: Recommend D/C HEP.     Goals Home Exercise Program Pt  will Perform Home Exercise Program: Independently PT Short Term Goals Time to Complete Short Term Goals: 2 weeks PT Short Term Goal 1: Pain no greater than a 5 80% of the day PT Short Term Goal 1 - Progress: Met PT Short Term Goal 2: Pt to be able to walk for 15 minutes without increased pain PT Short Term Goal 2 - Progress: Not met PT Short Term Goal 3: Pt ROM improved by 25% PT Long Term Goals Time to Complete Long Term Goals: 4 weeks PT Long Term Goal 1: Pt to states L radicular pain is not going past his knee. PT Long Term Goal 1 - Progress: Met PT Long Term Goal 2: Pt pain to be no greater than a 3 80% of the day PT Long Term Goal 2 - Progress: Met Long Term Goal 3: Pt ROM improved 50% to allow pt to pick an item off the floor Long Term Goal 4: Pt to be able to walk for 30 minutes without pain. Long Term Goal 4 Progress: Not met  Problem List Patient Active Problem List  Diagnosis  . Difficulty in walking  . Stiffness of joints, not elsewhere classified, multiple sites    PT - End of Session Activity Tolerance: Patient tolerated treatment well General Behavior During Session: [facility] for tasks performed Cognition: [facility] for tasks performed  Seth Bake, PTA 03/26/2012, 11:35 AM

## 2012-03-31 ENCOUNTER — Ambulatory Visit (HOSPITAL_COMMUNITY): Payer: Non-veteran care | Admitting: *Deleted

## 2018-08-05 ENCOUNTER — Encounter (HOSPITAL_COMMUNITY): Payer: Self-pay | Admitting: Emergency Medicine

## 2018-08-05 ENCOUNTER — Emergency Department (HOSPITAL_COMMUNITY): Payer: Medicare Other

## 2018-08-05 ENCOUNTER — Other Ambulatory Visit: Payer: Self-pay

## 2018-08-05 ENCOUNTER — Inpatient Hospital Stay (HOSPITAL_COMMUNITY)
Admission: EM | Admit: 2018-08-05 | Discharge: 2018-08-06 | DRG: 065 | Disposition: A | Discharge status: Home / Self Care | Payer: Medicare Other | Attending: Family Medicine | Admitting: Family Medicine

## 2018-08-05 DIAGNOSIS — I2510 Atherosclerotic heart disease of native coronary artery without angina pectoris: Secondary | ICD-10-CM | POA: Diagnosis present

## 2018-08-05 DIAGNOSIS — Z888 Allergy status to other drugs, medicaments and biological substances status: Secondary | ICD-10-CM | POA: Diagnosis not present

## 2018-08-05 DIAGNOSIS — R739 Hyperglycemia, unspecified: Secondary | ICD-10-CM

## 2018-08-05 DIAGNOSIS — R262 Difficulty in walking, not elsewhere classified: Secondary | ICD-10-CM

## 2018-08-05 DIAGNOSIS — R200 Anesthesia of skin: Secondary | ICD-10-CM | POA: Diagnosis present

## 2018-08-05 DIAGNOSIS — I6381 Other cerebral infarction due to occlusion or stenosis of small artery: Secondary | ICD-10-CM | POA: Diagnosis present

## 2018-08-05 DIAGNOSIS — Z86711 Personal history of pulmonary embolism: Secondary | ICD-10-CM

## 2018-08-05 DIAGNOSIS — Z7951 Long term (current) use of inhaled steroids: Secondary | ICD-10-CM | POA: Diagnosis not present

## 2018-08-05 DIAGNOSIS — I639 Cerebral infarction, unspecified: Secondary | ICD-10-CM | POA: Diagnosis not present

## 2018-08-05 DIAGNOSIS — Z7902 Long term (current) use of antithrombotics/antiplatelets: Secondary | ICD-10-CM | POA: Diagnosis not present

## 2018-08-05 DIAGNOSIS — E1140 Type 2 diabetes mellitus with diabetic neuropathy, unspecified: Secondary | ICD-10-CM | POA: Diagnosis present

## 2018-08-05 DIAGNOSIS — Z7982 Long term (current) use of aspirin: Secondary | ICD-10-CM | POA: Diagnosis not present

## 2018-08-05 DIAGNOSIS — E1165 Type 2 diabetes mellitus with hyperglycemia: Secondary | ICD-10-CM | POA: Diagnosis present

## 2018-08-05 DIAGNOSIS — M19041 Primary osteoarthritis, right hand: Secondary | ICD-10-CM | POA: Diagnosis present

## 2018-08-05 DIAGNOSIS — G8194 Hemiplegia, unspecified affecting left nondominant side: Secondary | ICD-10-CM | POA: Diagnosis present

## 2018-08-05 DIAGNOSIS — Z880 Allergy status to penicillin: Secondary | ICD-10-CM | POA: Diagnosis not present

## 2018-08-05 DIAGNOSIS — Z79899 Other long term (current) drug therapy: Secondary | ICD-10-CM | POA: Diagnosis not present

## 2018-08-05 DIAGNOSIS — R29701 NIHSS score 1: Secondary | ICD-10-CM | POA: Diagnosis present

## 2018-08-05 DIAGNOSIS — F17210 Nicotine dependence, cigarettes, uncomplicated: Secondary | ICD-10-CM | POA: Diagnosis present

## 2018-08-05 DIAGNOSIS — Z20828 Contact with and (suspected) exposure to other viral communicable diseases: Secondary | ICD-10-CM | POA: Diagnosis present

## 2018-08-05 DIAGNOSIS — E119 Type 2 diabetes mellitus without complications: Secondary | ICD-10-CM | POA: Diagnosis not present

## 2018-08-05 DIAGNOSIS — Z7984 Long term (current) use of oral hypoglycemic drugs: Secondary | ICD-10-CM

## 2018-08-05 DIAGNOSIS — E785 Hyperlipidemia, unspecified: Secondary | ICD-10-CM | POA: Diagnosis present

## 2018-08-05 DIAGNOSIS — Z720 Tobacco use: Secondary | ICD-10-CM

## 2018-08-05 DIAGNOSIS — R29898 Other symptoms and signs involving the musculoskeletal system: Secondary | ICD-10-CM | POA: Diagnosis present

## 2018-08-05 DIAGNOSIS — I10 Essential (primary) hypertension: Secondary | ICD-10-CM | POA: Diagnosis present

## 2018-08-05 DIAGNOSIS — M19042 Primary osteoarthritis, left hand: Secondary | ICD-10-CM | POA: Diagnosis present

## 2018-08-05 HISTORY — DX: Other pulmonary embolism without acute cor pulmonale: I26.99

## 2018-08-05 HISTORY — DX: Type 2 diabetes mellitus without complications: E11.9

## 2018-08-05 HISTORY — DX: Hyperlipidemia, unspecified: E78.5

## 2018-08-05 HISTORY — DX: Essential (primary) hypertension: I10

## 2018-08-05 LAB — COMPREHENSIVE METABOLIC PANEL
ALT: 31 U/L (ref 0–44)
AST: 27 U/L (ref 15–41)
Albumin: 3.4 g/dL — ABNORMAL LOW (ref 3.5–5.0)
Alkaline Phosphatase: 76 U/L (ref 38–126)
Anion gap: 13 (ref 5–15)
BUN: 15 mg/dL (ref 8–23)
CO2: 25 mmol/L (ref 22–32)
Calcium: 9.5 mg/dL (ref 8.9–10.3)
Chloride: 99 mmol/L (ref 98–111)
Creatinine, Ser: 0.99 mg/dL (ref 0.61–1.24)
GFR calc Af Amer: >60 mL/min (ref >60)
GFR calc non Af Amer: >60 mL/min (ref >60)
Glucose, Bld: 275 mg/dL — ABNORMAL HIGH (ref 70–99)
Potassium: 3.8 mmol/L (ref 3.5–5.1)
Sodium: 137 mmol/L (ref 135–145)
Total Bilirubin: 0.5 mg/dL (ref 0.3–1.2)
Total Protein: 7.2 g/dL (ref 6.5–8.1)

## 2018-08-05 LAB — URINALYSIS, ROUTINE W REFLEX MICROSCOPIC
Bacteria, UA: NONE SEEN
Bilirubin Urine: NEGATIVE
Glucose, UA: >=500 mg/dL — AB
Ketones, ur: NEGATIVE mg/dL
Leukocytes,Ua: NEGATIVE
Nitrite: NEGATIVE
Protein, ur: 100 mg/dL — AB
Specific Gravity, Urine: 1.016 (ref 1.005–1.030)
pH: 6 (ref 5.0–8.0)

## 2018-08-05 LAB — DIFFERENTIAL
Abs Immature Granulocytes: 0.04 10*3/uL (ref 0.00–0.07)
Basophils Absolute: 0.1 10*3/uL (ref 0.0–0.1)
Basophils Relative: 1 %
Eosinophils Absolute: 0.1 10*3/uL (ref 0.0–0.5)
Eosinophils Relative: 1 %
Immature Granulocytes: 0 %
Lymphocytes Relative: 23 %
Lymphs Abs: 2.3 10*3/uL (ref 0.7–4.0)
Monocytes Absolute: 0.6 10*3/uL (ref 0.1–1.0)
Monocytes Relative: 5 %
Neutro Abs: 7 10*3/uL (ref 1.7–7.7)
Neutrophils Relative %: 70 %

## 2018-08-05 LAB — CBC
HCT: 50.7 % (ref 39.0–52.0)
Hemoglobin: 16.9 g/dL (ref 13.0–17.0)
MCH: 30.6 pg (ref 26.0–34.0)
MCHC: 33.3 g/dL (ref 30.0–36.0)
MCV: 91.8 fL (ref 80.0–100.0)
Platelets: 151 10*3/uL (ref 150–400)
RBC: 5.52 MIL/uL (ref 4.22–5.81)
RDW: 14.2 % (ref 11.5–15.5)
WBC: 10.2 10*3/uL (ref 4.0–10.5)
nRBC: 0 % (ref 0.0–0.2)

## 2018-08-05 LAB — GLUCOSE, CAPILLARY
Glucose-Capillary: 114 mg/dL — ABNORMAL HIGH (ref 70–99)
Glucose-Capillary: 164 mg/dL — ABNORMAL HIGH (ref 70–99)

## 2018-08-05 LAB — RAPID URINE DRUG SCREEN, HOSP PERFORMED
Amphetamines: NOT DETECTED
Barbiturates: NOT DETECTED
Benzodiazepines: NOT DETECTED
Cocaine: NOT DETECTED
Opiates: NOT DETECTED
Tetrahydrocannabinol: NOT DETECTED

## 2018-08-05 LAB — PROTIME-INR
INR: 1 (ref 0.8–1.2)
Prothrombin Time: 13.1 seconds (ref 11.4–15.2)

## 2018-08-05 LAB — CBG MONITORING, ED
Glucose-Capillary: 281 mg/dL — ABNORMAL HIGH (ref 70–99)
Glucose-Capillary: 151 mg/dL — ABNORMAL HIGH (ref 70–99)

## 2018-08-05 LAB — HEMOGLOBIN A1C
Hgb A1c MFr Bld: 8.1 % — ABNORMAL HIGH (ref 4.8–5.6)
Mean Plasma Glucose: 185.77 mg/dL

## 2018-08-05 LAB — SARS CORONAVIRUS 2 BY RT PCR (HOSPITAL ORDER, PERFORMED IN ~~LOC~~ HOSPITAL LAB): SARS Coronavirus 2: NEGATIVE

## 2018-08-05 LAB — APTT: aPTT: 27 seconds (ref 24–36)

## 2018-08-05 LAB — ETHANOL: Alcohol, Ethyl (B): <10 mg/dL (ref <10)

## 2018-08-05 MED ORDER — ENOXAPARIN SODIUM 40 MG/0.4ML ~~LOC~~ SOLN
40.0000 mg | SUBCUTANEOUS | Status: DC
  Administered 2018-08-05: 40 mg via SUBCUTANEOUS
  Filled 2018-08-05: qty 0.4

## 2018-08-05 MED ORDER — ACETAMINOPHEN 160 MG/5ML PO SOLN: 650.0000 mg | ORAL | Status: DC | PRN

## 2018-08-05 MED ORDER — ASPIRIN 325 MG PO TABS
325.0000 mg | ORAL_TABLET | Freq: Once | ORAL | Status: AC
  Administered 2018-08-05: 325 mg via ORAL
  Filled 2018-08-05: qty 1

## 2018-08-05 MED ORDER — FLUTICASONE PROPIONATE 50 MCG/ACT NA SUSP: 1.0000 | Freq: Two times a day (BID) | NASAL | Status: DC | PRN

## 2018-08-05 MED ORDER — SODIUM CHLORIDE 0.9 % IV SOLN
INTRAVENOUS | Status: DC
  Administered 2018-08-05: 18:00:00 via INTRAVENOUS

## 2018-08-05 MED ORDER — ASPIRIN EC 81 MG PO TBEC
81.0000 mg | DELAYED_RELEASE_TABLET | Freq: Every day | ORAL | Status: DC
  Administered 2018-08-06: 81 mg via ORAL
  Filled 2018-08-05: qty 1

## 2018-08-05 MED ORDER — SENNOSIDES-DOCUSATE SODIUM 8.6-50 MG PO TABS: 1.0000 | ORAL_TABLET | Freq: Every evening | ORAL | Status: DC | PRN

## 2018-08-05 MED ORDER — INSULIN ASPART 100 UNIT/ML ~~LOC~~ SOLN
0.0000 [IU] | Freq: Three times a day (TID) | SUBCUTANEOUS | Status: DC
  Administered 2018-08-06 (×2): 3 [IU] via SUBCUTANEOUS
  Administered 2018-08-06: 5 [IU] via SUBCUTANEOUS

## 2018-08-05 MED ORDER — ACETAMINOPHEN 325 MG PO TABS: 650.0000 mg | ORAL_TABLET | ORAL | Status: DC | PRN

## 2018-08-05 MED ORDER — STROKE: EARLY STAGES OF RECOVERY BOOK
Freq: Once | Status: AC
  Administered 2018-08-05: 18:00:00

## 2018-08-05 MED ORDER — CLOPIDOGREL BISULFATE 75 MG PO TABS
75.0000 mg | ORAL_TABLET | Freq: Every day | ORAL | Status: DC
  Administered 2018-08-05 – 2018-08-06 (×2): 75 mg via ORAL
  Filled 2018-08-05 (×2): qty 1

## 2018-08-05 MED ORDER — ACETAMINOPHEN 650 MG RE SUPP: 650.0000 mg | RECTAL | Status: DC | PRN

## 2018-08-05 NOTE — ED Provider Notes (Signed)
[facility] EMERGENCY DEPARTMENT Provider Note   CSN: [id number] Arrival date & time: 08/05/18  1119    History   Chief Complaint Chief Complaint  Patient presents with   Numbness    HPI Alex Salazar is a 73 y.o. male.     Pt presents to the ED today with left arm and leg numbness.  The pt said he has had intermittent numbness to left arm and leg for the past 4 weeks.  However, 2-3 days ago, it has remained constant.  He thought it would just go away, so he did not come in immediately.  The pt denies any trouble speaking or swallowing.  He said his left leg feels like it is going to give out.  He has never had a stroke, but has multiple other medical problems.  Hx DM, htn, hyperlipidemia, CAD.  [facility] in [place]; we will attempt to get records.      Past Medical History:  Diagnosis Date   Diabetes mellitus without complication (HCC)    Hyperlipidemia    Hypertension    PE (pulmonary thromboembolism) (HCC)     Patient Active Problem List   Diagnosis Date Noted   Difficulty in walking(719.7) 02/04/2012   Stiffness of joints, not elsewhere classified, multiple sites 02/04/2012       Home Medications    Prior to Admission medications   Medication Sig Start Date End Date Taking? Authorizing Provider  aspirin 325 MG EC tablet Take 325 mg by mouth daily.      [provider]  atorvastatin (LIPITOR) 80 MG tablet Take 80 mg by mouth daily. Take 1/2 tablet daily     [provider]  cholecalciferol (VITAMIN D) 400 UNITS TABS Take 5,000 Units by mouth daily.      [provider]  clopidogrel (PLAVIX) 75 MG tablet Take 75 mg by mouth daily.      [provider]  doxazosin (CARDURA) 4 MG tablet Take 4 mg by mouth at bedtime. Take 1/2 tablet at bedtime      [provider]  lisinopril (PRINIVIL,ZESTRIL) 20 MG tablet Take 20 mg by mouth daily.      [provider]  metFORMIN (GLUCOPHAGE) 1000 MG  tablet Take 1,000 mg by mouth 2 (two) times daily with a meal.      [provider]  metoprolol tartrate (LOPRESSOR) 25 MG tablet Take 25 mg by mouth 2 (two) times daily.      [provider]    Family History No family history on file.  Social History Social History   Tobacco Use   Smoking status: Current Every Day Smoker    Packs/day: 0.50    Types: Cigarettes   Smokeless tobacco: Never Used  Substance Use Topics   Alcohol use: Not Currently   Drug use: Never     Allergies   Penicillins and Statins   Review of Systems Review of Systems  Neurological: Positive for weakness and numbness.  All other systems reviewed and are negative.    Physical Exam Updated Vital Signs BP (!) 166/98    Pulse 76    Temp 97.7 F (36.5 C) (Oral)    Resp 20    Ht 5\' 11"  (1.803 m)    Wt 99.8 kg    SpO2 98%    BMI 30.68 kg/m   Physical Exam Vitals signs and nursing note reviewed.  Constitutional:      Appearance: Normal appearance.  HENT:  Head: Normocephalic and atraumatic.     Right Ear: External ear normal.     Left Ear: External ear normal.     Nose: Nose normal.     Mouth/Throat:     Mouth: Mucous membranes are moist.     Pharynx: Oropharynx is clear.  Eyes:     Extraocular Movements: Extraocular movements intact.     Conjunctiva/sclera: Conjunctivae normal.     Pupils: Pupils are equal, round, and reactive to light.  Neck:     Musculoskeletal: Normal range of motion and neck supple.  Cardiovascular:     Rate and Rhythm: Normal rate and regular rhythm.     Pulses: Normal pulses.     Heart sounds: Normal heart sounds.  Pulmonary:     Effort: Pulmonary effort is normal.     Breath sounds: Normal breath sounds.  Abdominal:     General: Abdomen is flat. Bowel sounds are normal.     Palpations: Abdomen is soft.  Musculoskeletal: Normal range of motion.  Skin:    General: Skin is warm and dry.     Capillary Refill: Capillary refill takes less than  2 seconds.  Neurological:     Mental Status: He is alert and oriented to person, place, and time.     Comments: Left arm and leg weakness  Psychiatric:        Mood and Affect: Mood normal.        Behavior: Behavior normal.      ED Treatments / Results  Labs (all labs ordered are listed, but only abnormal results are displayed) Labs Reviewed  COMPREHENSIVE METABOLIC PANEL - Abnormal; Notable for the following components:      Result Value   Glucose, Bld 275 (*)    Albumin 3.4 (*)    All other components within normal limits  CBG MONITORING, ED - Abnormal; Notable for the following components:   Glucose-Capillary 281 (*)    All other components within normal limits  CBG MONITORING, ED - Abnormal; Notable for the following components:   Glucose-Capillary 151 (*)    All other components within normal limits  SARS CORONAVIRUS 2 (HOSPITAL ORDER, PERFORMED IN [facility])  ETHANOL  PROTIME-INR  APTT  CBC  DIFFERENTIAL  RAPID URINE DRUG SCREEN, HOSP PERFORMED  URINALYSIS, ROUTINE W REFLEX MICROSCOPIC    EKG EKG Interpretation  Date/Time:  Wednesday Aug 05 2018 11:28:24 EDT Ventricular Rate:  91 PR Interval:    QRS Duration: 109 QT Interval:  355 QTC Calculation: 437 R Axis:   -29 Text Interpretation:  Sinus rhythm Atrial premature complex Borderline left axis deviation RSR' in V1 or V2, right VCD or RVH Consider inferior infarct Baseline wander in lead(s) V3 No old tracing to compare Confirmed by Jacalyn Lefevre [phone]) on 08/05/2018 11:44:29 AM   Radiology Ct Head Wo Contrast  Result Date: 08/05/2018 CLINICAL DATA:  Pt c/o periods of left sided weakness and numbness for 3-4wks. This time it has been going on for 2dys.Focal neuro deficit, > 6 hrs, stroke suspected EXAM: CT HEAD WITHOUT CONTRAST TECHNIQUE: Contiguous axial images were obtained from the base of the skull through the vertex without intravenous contrast. COMPARISON:  None. FINDINGS: Brain: No acute  intracranial hemorrhage. No focal mass lesion. No CT evidence of acute cortical infarction. No midline shift or mass effect. No hydrocephalus. Basilar cisterns are patent. Hypodensity at the junction of the posterior limb of the RIGHT internal capsule and lentiform nucleus measures 13 mm x 9 mm (  image 16/2). Vascular: No hyperdense vessel or unexpected calcification. Skull: Normal. Negative for fracture or focal lesion. Sinuses/Orbits: No acute finding. Other: None IMPRESSION: 1. Age-indeterminate infarction in the posterior RIGHT basal ganglia. 2. Mild subcortical white matter microvascular disease. Electronically Signed   By: Genevive Bi M.D.   On: 08/05/2018 12:27   [name] Head Wo Contrast  Result Date: 08/05/2018 CLINICAL DATA:  Left arm and leg numbness EXAM: MRI HEAD WITHOUT CONTRAST MRA HEAD WITHOUT CONTRAST TECHNIQUE: Multiplanar, multiecho pulse sequences of the brain and surrounding structures were obtained without intravenous contrast. Angiographic images of the head were obtained using MRA technique without contrast. COMPARISON:  Head CT 08/05/2018 FINDINGS: MRI HEAD FINDINGS BRAIN: Small acute infarct of the posterior aspect of the right lentiform nucleus and tail of the caudate. There is a small, old infarct of the right thalamus. The midline structures are normal. The white matter signal is normal for the patient's age. The CSF spaces are normal for age, with no hydrocephalus. Susceptibility-sensitive sequences show no chronic microhemorrhage or superficial siderosis. SKULL AND UPPER CERVICAL SPINE: The visualized skull base, calvarium, upper cervical spine and extracranial soft tissues are normal. SINUSES/ORBITS: No fluid levels or advanced mucosal thickening. No mastoid or middle ear effusion. The orbits are normal. MRA HEAD FINDINGS POSTERIOR CIRCULATION: --Vertebral arteries: Normal codominant configuration of V4 segments. --Posterior inferior cerebellar arteries (PICA): Patent origins  from the vertebral arteries. --Anterior inferior cerebellar arteries (AICA): Patent origins from the basilar artery. --Basilar artery: Normal. --Superior cerebellar arteries: Normal. --Posterior cerebral arteries (PCA): Normal. Both originate from the basilar artery. Posterior communicating arteries (p-comm) are diminutive or absent. ANTERIOR CIRCULATION: --Intracranial internal carotid arteries: Normal. --Anterior cerebral arteries (ACA): Normal. Hypoplastic right A1 segment, normal variant. --Middle cerebral arteries (MCA): The right MCA M2 branches are attenuated, but this may be exaggerated by motion. Normal left MCA. IMPRESSION: 1. Small acute infarct of the right basal ganglia without hemorrhage or mass effect. This location affects the right internal capsule and is in keeping with the reported left-sided symptoms. 2. No large vessel occlusion. 3. Narrowed appearance of the right MCA M2 branches is probably exaggerated by motion artifact. Electronically Signed   By: Deatra Robinson M.D.   On: 08/05/2018 14:04   Mr Brain Wo Contrast  Result Date: 08/05/2018 CLINICAL DATA:  Left arm and leg numbness EXAM: MRI HEAD WITHOUT CONTRAST MRA HEAD WITHOUT CONTRAST TECHNIQUE: Multiplanar, multiecho pulse sequences of the brain and surrounding structures were obtained without intravenous contrast. Angiographic images of the head were obtained using MRA technique without contrast. COMPARISON:  Head CT 08/05/2018 FINDINGS: MRI HEAD FINDINGS BRAIN: Small acute infarct of the posterior aspect of the right lentiform nucleus and tail of the caudate. There is a small, old infarct of the right thalamus. The midline structures are normal. The white matter signal is normal for the patient's age. The CSF spaces are normal for age, with no hydrocephalus. Susceptibility-sensitive sequences show no chronic microhemorrhage or superficial siderosis. SKULL AND UPPER CERVICAL SPINE: The visualized skull base, calvarium, upper cervical  spine and extracranial soft tissues are normal. SINUSES/ORBITS: No fluid levels or advanced mucosal thickening. No mastoid or middle ear effusion. The orbits are normal. MRA HEAD FINDINGS POSTERIOR CIRCULATION: --Vertebral arteries: Normal codominant configuration of V4 segments. --Posterior inferior cerebellar arteries (PICA): Patent origins from the vertebral arteries. --Anterior inferior cerebellar arteries (AICA): Patent origins from the basilar artery. --Basilar artery: Normal. --Superior cerebellar arteries: Normal. --Posterior cerebral arteries (PCA): Normal. Both originate from the basilar  artery. Posterior communicating arteries (p-comm) are diminutive or absent. ANTERIOR CIRCULATION: --Intracranial internal carotid arteries: Normal. --Anterior cerebral arteries (ACA): Normal. Hypoplastic right A1 segment, normal variant. --Middle cerebral arteries (MCA): The right MCA M2 branches are attenuated, but this may be exaggerated by motion. Normal left MCA. IMPRESSION: 1. Small acute infarct of the right basal ganglia without hemorrhage or mass effect. This location affects the right internal capsule and is in keeping with the reported left-sided symptoms. 2. No large vessel occlusion. 3. Narrowed appearance of the right MCA M2 branches is probably exaggerated by motion artifact. Electronically Signed   By: Deatra Robinson M.D.   On: 08/05/2018 14:04    Procedures Procedures (including critical care time)  Medications Ordered in ED Medications  aspirin tablet 325 mg (has no administration in time range)     Initial Impression / Assessment and Plan / ED Course  I have reviewed the triage vital signs and the nursing notes.  Pertinent labs & imaging results that were available during my care of the patient were reviewed by me and considered in my medical decision making (see chart for details).  [facility]  I asked the nurse to put in the correct medications.   Pt was d/w Dr. Gerilyn Pilgrim  (neurology).  As CVA occurred 2-3 days ago and there is no large vessel occlusion, it is ok that pt stays here.  He recommended adding ASA to Plavix.  Pt d/w Dr. Laural Benes (triad) for admission for stroke work up.  CRITICAL CARE Performed by: Jacalyn Lefevre   Total critical care time: 30 minutes  Critical care time was exclusive of separately billable procedures and treating other patients.  Critical care was necessary to treat or prevent imminent or life-threatening deterioration.  Critical care was time spent personally by me on the following activities: development of treatment plan with patient and/or surrogate as well as nursing, discussions with consultants, evaluation of patient's response to treatment, examination of patient, obtaining history from patient or surrogate, ordering and performing treatments and interventions, ordering and review of laboratory studies, ordering and review of radiographic studies, pulse oximetry and re-evaluation of patient's condition.     Final Clinical Impressions(s) / ED Diagnoses   Final diagnoses:  Cerebrovascular accident (CVA), unspecified mechanism (HCC)  Hyperglycemia  Tobacco abuse    ED Discharge Orders    None       Jacalyn Lefevre, MD 08/05/18 1434

## 2018-08-05 NOTE — ED Notes (Signed)
Patient transported to MRI 

## 2018-08-05 NOTE — ED Triage Notes (Signed)
Pt states that he has been having numbness in his left arm and leg for 4 weeks. This time it has been going on for 2 days

## 2018-08-05 NOTE — Evaluation (Signed)
Speech Language Pathology Evaluation Patient Details Name: Alex Salazar MRN: [id number] DOB: 01/13/1946 Today's Date: 08/05/2018 Time: 1224-8250 SLP Time Calculation (min) (ACUTE ONLY): 24 min  Problem List:  Patient Active Problem List   Diagnosis Date Noted  . Acute CVA (cerebrovascular accident) (HCC) 08/05/2018  . Left leg weakness 08/05/2018  . Hyperlipidemia 08/05/2018  . Diabetes mellitus without complication (HCC) 08/05/2018  . Hypertension 08/05/2018  . Tobacco abuse 08/05/2018  . Difficulty walking 02/04/2012  . Stiffness of joints, not elsewhere classified, multiple sites 02/04/2012   Past Medical History:  Past Medical History:  Diagnosis Date  . Diabetes mellitus without complication (HCC)   . Hyperlipidemia   . Hypertension   . PE (pulmonary thromboembolism) (HCC)    Past Surgical History:  Past Surgical History:  Procedure Laterality Date  . BYPASS GRAFT     HPI:  Alex Salazar is a 73 y.o. male has been having intermittent symptoms of left leg and left arm numbness for the past 4 weeks.  He was afraid to come to the emergency department because of COVID-19.  He says that his symptoms have been intermittently but in the past 2 to 3 days his symptoms have worsened and become constant.  His symptoms did not go away which made him worry.  He came to the emergency department today because he felt increasing left leg numbness and weakness and felt concerned that his left leg would give out.  He has hypertension diabetes, hyperlipidemia and coronary artery disease. Patient had an MRI and MRA brain performed in the ED that was positive for acute CVA.  He is being admitted for further management.   Assessment / Plan / Recommendation Clinical Impression  Cognitive linguistic skills are WNL for items assessed. Pt recalled 3/4 words after 7 minute delay and was able to recall the fourth with a category cue. He expressed complex thoughts and feelings independently  and demonstrated WNL auditory comprehension. Pt's primary complaint is weakness in his left upper extremity greater than left lower extremity. He lives alone and is independent with all aspects of care prior to admission. No further SLP services indicated at this time. SLP will sign off.     SLP Assessment  SLP Recommendation/Assessment: Patient does not need any further Speech Lanaguage Pathology Services SLP Visit Diagnosis: Cognitive communication deficit (R41.841)    Follow Up Recommendations  None    Frequency and Duration           SLP Evaluation Cognition  Overall Cognitive Status: Within Functional Limits for tasks assessed Arousal/Alertness: Awake/alert Orientation Level: Oriented X4 Memory: Appears intact Awareness: Appears intact Problem Solving: Appears intact Safety/Judgment: Appears intact       Comprehension  Auditory Comprehension Overall Auditory Comprehension: Appears within functional limits for tasks assessed Yes/No Questions: Within Functional Limits Commands: Within Functional Limits Conversation: Complex Visual Recognition/Discrimination Discrimination: Within Function Limits Reading Comprehension Reading Status: Within funtional limits    Expression Expression Primary Mode of Expression: Verbal Verbal Expression Overall Verbal Expression: Appears within functional limits for tasks assessed Initiation: No impairment Automatic Speech: Name;Social Response;Counting Level of Generative/Spontaneous Verbalization: Conversation Repetition: No impairment Naming: No impairment Pragmatics: No impairment Non-Verbal Means of Communication: Not applicable Written Expression Dominant Hand: Right Written Expression: Not tested   Oral / Motor  Oral Motor/Sensory Function Overall Oral Motor/Sensory Function: Within functional limits Motor Speech Overall Motor Speech: Appears within functional limits for tasks assessed Respiration: Within functional  limits Phonation: Normal Resonance: Within functional limits Articulation: Within  functional limitis Intelligibility: Intelligible Motor Planning: Witnin functional limits Motor Speech Errors: Not applicable   Thank you,  Alex Salazar, CCC-SLP [phone]                     Jyssica Rief 08/05/2018, 7:01 PM

## 2018-08-05 NOTE — H&P (Addendum)
History and Physical  Alex NunneryGary J Salazar WUJ:[id number] DOB: 08/25/1945 DOA: 08/05/2018  Referring physician: Particia NearingHaviland MD   PCP: Alex Salazar, Brent A, MD   Chief Complaint: left leg weakness   HPI: Alex Salazar is a 73 y.o. male has been having intermittent symptoms of left leg and left arm numbness for the past 4 weeks.  He was afraid to come to the emergency department because of COVID-19.  He says that his symptoms have been intermittently but in the past 2 to 3 days his symptoms have worsened and become constant.  His symptoms did not go away which made him worry.  He came to the emergency department today because he felt increasing left leg numbness and weakness and felt concerned that his left leg would give out.  He has hypertension diabetes, hyperlipidemia and coronary artery disease.  ED course: Patient had an MRI and MRA brain performed in the ED that was positive for acute CVA.  He is being admitted for further management.   Review of Systems: All systems reviewed and apart from history of presenting illness, are negative.  Past Medical History:  Diagnosis Date   Diabetes mellitus without complication (HCC)    Hyperlipidemia    Hypertension    PE (pulmonary thromboembolism) (HCC)    Past Surgical History:  Procedure Laterality Date   BYPASS GRAFT     Social History:  reports that he has been smoking cigarettes. He has been smoking about 0.50 packs per day. He has never used smokeless tobacco. He reports previous alcohol use. He reports that he does not use drugs.  Allergies  Allergen Reactions   Penicillins    Statins     History reviewed. No pertinent family history.  Prior to Admission medications   Medication Sig Start Date End Date Taking? Authorizing Provider  clopidogrel (PLAVIX) 75 MG tablet Take 75 mg by mouth daily.     Yes [provider]  doxazosin (CARDURA) 4 MG tablet Take 4 mg by mouth at bedtime. Take 1/2 tablet at bedtime      Yes [provider]  lisinopril (PRINIVIL,ZESTRIL) 20 MG tablet Take 20 mg by mouth daily.     Yes [provider]  metFORMIN (GLUCOPHAGE) 1000 MG tablet Take 1,000 mg by mouth 2 (two) times daily with a meal.     Yes [provider]  metoprolol tartrate (LOPRESSOR) 25 MG tablet Take 25 mg by mouth 2 (two) times daily.     Yes [provider]   Physical Exam: Vitals:   08/05/18 1200 08/05/18 1230 08/05/18 1300 08/05/18 1414  BP: (!) 152/100 (!) 142/90 (!) 160/92 (!) 166/98  Pulse: 85 83 76 76  Resp: 19 18 18 20   Temp:      TempSrc:      SpO2: 96% 96% 96% 98%  Weight:      Height:         General exam: Moderately built and nourished patient, lying comfortably supine on the gurney in no obvious distress.  Head, eyes and ENT: Nontraumatic and normocephalic. Pupils equally reacting to light and accommodation. Oral mucosa moist.  Neck: Supple. No JVD, carotid bruit or thyromegaly.  Lymphatics: No lymphadenopathy.  Respiratory system: Clear to auscultation. No increased work of breathing.  Cardiovascular system: S1 and S2 heard, RRR. No JVD, murmurs, gallops, clicks or pedal edema.  Gastrointestinal system: Abdomen is nondistended, soft and nontender. Normal bowel sounds heard. No organomegaly or masses appreciated.  Central nervous system: Alert  and oriented. No focal neurological deficits.  Extremities: LUE/LLE strength 4/5. Peripheral pulses symmetrically felt.   Skin: No rashes or acute findings.  Musculoskeletal system: Negative exam.  Psychiatry: Pleasant and cooperative.  Labs on Admission:  Basic Metabolic Panel: Recent Labs  Lab 08/05/18 1151  NA 137  K 3.8  CL 99  CO2 25  GLUCOSE 275*  BUN 15  CREATININE 0.99  CALCIUM 9.5   Liver Function Tests: Recent Labs  Lab 08/05/18 1151  AST 27  ALT 31  ALKPHOS 76  BILITOT 0.5  PROT 7.2  ALBUMIN 3.4*   No results for input(s): LIPASE, AMYLASE in the last 168 hours. No  results for input(s): AMMONIA in the last 168 hours. CBC: Recent Labs  Lab 08/05/18 1151  WBC 10.2  NEUTROABS 7.0  HGB 16.9  HCT 50.7  MCV 91.8  PLT 151   Cardiac Enzymes: No results for input(s): CKTOTAL, CKMB, CKMBINDEX, TROPONINI in the last 168 hours.  BNP (last 3 results) No results for input(s): PROBNP in the last 8760 hours. CBG: Recent Labs  Lab 08/05/18 1130 08/05/18 1422  GLUCAP 281* 151*    Radiological Exams on Admission: Ct Head Wo Contrast  Result Date: 08/05/2018 CLINICAL DATA:  Pt c/o periods of left sided weakness and numbness for 3-4wks. This time it has been going on for 2dys.Focal neuro deficit, > 6 hrs, stroke suspected EXAM: CT HEAD WITHOUT CONTRAST TECHNIQUE: Contiguous axial images were obtained from the base of the skull through the vertex without intravenous contrast. COMPARISON:  None. FINDINGS: Brain: No acute intracranial hemorrhage. No focal mass lesion. No CT evidence of acute cortical infarction. No midline shift or mass effect. No hydrocephalus. Basilar cisterns are patent. Hypodensity at the junction of the posterior limb of the RIGHT internal capsule and lentiform nucleus measures 13 mm x 9 mm (image 16/2). Vascular: No hyperdense vessel or unexpected calcification. Skull: Normal. Negative for fracture or focal lesion. Sinuses/Orbits: No acute finding. Other: None IMPRESSION: 1. Age-indeterminate infarction in the posterior RIGHT basal ganglia. 2. Mild subcortical white matter microvascular disease. Electronically Signed   By: Genevive Bi M.D.   On: 08/05/2018 12:27   Mr Alex Salazar Head Wo Contrast  Result Date: 08/05/2018 CLINICAL DATA:  Left arm and leg numbness EXAM: MRI HEAD WITHOUT CONTRAST MRA HEAD WITHOUT CONTRAST TECHNIQUE: Multiplanar, multiecho pulse sequences of the brain and surrounding structures were obtained without intravenous contrast. Angiographic images of the head were obtained using MRA technique without contrast. COMPARISON:  Head  CT 08/05/2018 FINDINGS: MRI HEAD FINDINGS BRAIN: Small acute infarct of the posterior aspect of the right lentiform nucleus and tail of the caudate. There is a small, old infarct of the right thalamus. The midline structures are normal. The white matter signal is normal for the patient's age. The CSF spaces are normal for age, with no hydrocephalus. Susceptibility-sensitive sequences show no chronic microhemorrhage or superficial siderosis. SKULL AND UPPER CERVICAL SPINE: The visualized skull base, calvarium, upper cervical spine and extracranial soft tissues are normal. SINUSES/ORBITS: No fluid levels or advanced mucosal thickening. No mastoid or middle ear effusion. The orbits are normal. MRA HEAD FINDINGS POSTERIOR CIRCULATION: --Vertebral arteries: Normal codominant configuration of V4 segments. --Posterior inferior cerebellar arteries (PICA): Patent origins from the vertebral arteries. --Anterior inferior cerebellar arteries (AICA): Patent origins from the basilar artery. --Basilar artery: Normal. --Superior cerebellar arteries: Normal. --Posterior cerebral arteries (PCA): Normal. Both originate from the basilar artery. Posterior communicating arteries (p-comm) are diminutive or absent. ANTERIOR CIRCULATION: --Intracranial internal  carotid arteries: Normal. --Anterior cerebral arteries (ACA): Normal. Hypoplastic right A1 segment, normal variant. --Middle cerebral arteries (MCA): The right MCA M2 branches are attenuated, but this may be exaggerated by motion. Normal left MCA. IMPRESSION: 1. Small acute infarct of the right basal ganglia without hemorrhage or mass effect. This location affects the right internal capsule and is in keeping with the reported left-sided symptoms. 2. No large vessel occlusion. 3. Narrowed appearance of the right MCA M2 branches is probably exaggerated by motion artifact. Electronically Signed   By: Deatra Robinson M.D.   On: 08/05/2018 14:04   Mr Brain Wo Contrast  Result Date:  08/05/2018 CLINICAL DATA:  Left arm and leg numbness EXAM: MRI HEAD WITHOUT CONTRAST MRA HEAD WITHOUT CONTRAST TECHNIQUE: Multiplanar, multiecho pulse sequences of the brain and surrounding structures were obtained without intravenous contrast. Angiographic images of the head were obtained using MRA technique without contrast. COMPARISON:  Head CT 08/05/2018 FINDINGS: MRI HEAD FINDINGS BRAIN: Small acute infarct of the posterior aspect of the right lentiform nucleus and tail of the caudate. There is a small, old infarct of the right thalamus. The midline structures are normal. The white matter signal is normal for the patient's age. The CSF spaces are normal for age, with no hydrocephalus. Susceptibility-sensitive sequences show no chronic microhemorrhage or superficial siderosis. SKULL AND UPPER CERVICAL SPINE: The visualized skull base, calvarium, upper cervical spine and extracranial soft tissues are normal. SINUSES/ORBITS: No fluid levels or advanced mucosal thickening. No mastoid or middle ear effusion. The orbits are normal. MRA HEAD FINDINGS POSTERIOR CIRCULATION: --Vertebral arteries: Normal codominant configuration of V4 segments. --Posterior inferior cerebellar arteries (PICA): Patent origins from the vertebral arteries. --Anterior inferior cerebellar arteries (AICA): Patent origins from the basilar artery. --Basilar artery: Normal. --Superior cerebellar arteries: Normal. --Posterior cerebral arteries (PCA): Normal. Both originate from the basilar artery. Posterior communicating arteries (p-comm) are diminutive or absent. ANTERIOR CIRCULATION: --Intracranial internal carotid arteries: Normal. --Anterior cerebral arteries (ACA): Normal. Hypoplastic right A1 segment, normal variant. --Middle cerebral arteries (MCA): The right MCA M2 branches are attenuated, but this may be exaggerated by motion. Normal left MCA. IMPRESSION: 1. Small acute infarct of the right basal ganglia without hemorrhage or mass effect.  This location affects the right internal capsule and is in keeping with the reported left-sided symptoms. 2. No large vessel occlusion. 3. Narrowed appearance of the right MCA M2 branches is probably exaggerated by motion artifact. Electronically Signed   By: Deatra Robinson M.D.   On: 08/05/2018 14:04   Assessment/Plan Principal Problem:   Acute CVA (cerebrovascular accident) [facility]) Active Problems:   Difficulty walking   Left leg weakness   Hyperlipidemia   Diabetes mellitus without complication (HCC)   Hypertension   Tobacco abuse   1. Acute CVA- MRI suggests right basal ganglial infarct which is acute.  Admit for stroke work-up.  Obtain echocardiogram, PT OT evaluation, check lipids and A1c.  Add aspirin to Plavix which she had been taking prior to arrival.  He is reporting intolerance to statins at this time.  I have asked for neurology consultation. 2. Essential hypertension-holding home medications for permissive hypertension. 3. Hyperlipidemia-patient reports intolerance to statins 4. CAD - continue plavix, added aspirin for secondary stroke prevention.  5. Type 2 DM - check CBG and sliding scale coverage ordered, carb modified diet.    DVT Prophylaxis: lovenox Code Status: Full   Family Communication:   Disposition Plan: inpatient    Time spent: 55 mins  Standley Dakins, MD [facility] How to contact the [facility] Attending or Consulting provider 7A - 7P or covering provider during after hours 7P -7A, for this patient?  1. Check the care team in [facility] and look for a) attending/consulting [facility] provider listed and b) the [facility] team listed 2. Log into [email or web address] and use [facility]'s universal password to access. If you do not have the password, please contact the hospital operator. 3. Locate the [facility] provider you are looking for under [facility] and page to a number that you can be directly reached. 4. If you still have difficulty reaching the provider, please page the [facility]  (Director on Call) for the Hospitalists listed on amion for assistance.

## 2018-08-06 ENCOUNTER — Inpatient Hospital Stay (HOSPITAL_COMMUNITY): Payer: Medicare Other

## 2018-08-06 DIAGNOSIS — I639 Cerebral infarction, unspecified: Secondary | ICD-10-CM

## 2018-08-06 LAB — COMPREHENSIVE METABOLIC PANEL
ALT: 27 U/L (ref 0–44)
AST: 19 U/L (ref 15–41)
Albumin: 3.3 g/dL — ABNORMAL LOW (ref 3.5–5.0)
Alkaline Phosphatase: 69 U/L (ref 38–126)
Anion gap: 11 (ref 5–15)
BUN: 16 mg/dL (ref 8–23)
CO2: 24 mmol/L (ref 22–32)
Calcium: 9.4 mg/dL (ref 8.9–10.3)
Chloride: 102 mmol/L (ref 98–111)
Creatinine, Ser: 0.92 mg/dL (ref 0.61–1.24)
GFR calc Af Amer: >60 mL/min (ref >60)
GFR calc non Af Amer: >60 mL/min (ref >60)
Glucose, Bld: 230 mg/dL — ABNORMAL HIGH (ref 70–99)
Potassium: 4.1 mmol/L (ref 3.5–5.1)
Sodium: 137 mmol/L (ref 135–145)
Total Bilirubin: 0.7 mg/dL (ref 0.3–1.2)
Total Protein: 7 g/dL (ref 6.5–8.1)

## 2018-08-06 LAB — CBC
HCT: 50.6 % (ref 39.0–52.0)
Hemoglobin: 16.7 g/dL (ref 13.0–17.0)
MCH: 30.5 pg (ref 26.0–34.0)
MCHC: 33 g/dL (ref 30.0–36.0)
MCV: 92.5 fL (ref 80.0–100.0)
Platelets: 158 10*3/uL (ref 150–400)
RBC: 5.47 MIL/uL (ref 4.22–5.81)
RDW: 14.1 % (ref 11.5–15.5)
WBC: 12.3 10*3/uL — ABNORMAL HIGH (ref 4.0–10.5)
nRBC: 0 % (ref 0.0–0.2)

## 2018-08-06 LAB — GLUCOSE, CAPILLARY
Glucose-Capillary: 259 mg/dL — ABNORMAL HIGH (ref 70–99)
Glucose-Capillary: 229 mg/dL — ABNORMAL HIGH (ref 70–99)
Glucose-Capillary: 243 mg/dL — ABNORMAL HIGH (ref 70–99)

## 2018-08-06 LAB — ECHOCARDIOGRAM COMPLETE
Height: 71 in
Weight: 3539.2 oz

## 2018-08-06 LAB — LIPID PANEL
Cholesterol: 179 mg/dL (ref 0–200)
HDL: 25 mg/dL — ABNORMAL LOW (ref >40)
LDL Cholesterol: 105 mg/dL — ABNORMAL HIGH (ref 0–99)
Total CHOL/HDL Ratio: 7.2 RATIO
Triglycerides: 247 mg/dL — ABNORMAL HIGH (ref <150)
VLDL: 49 mg/dL — ABNORMAL HIGH (ref 0–40)

## 2018-08-06 MED ORDER — PRAVASTATIN SODIUM 10 MG PO TABS: 10.0000 mg | ORAL_TABLET | Freq: Every evening | ORAL | 11 refills | Status: DC

## 2018-08-06 MED ORDER — NICOTINE 21 MG/24HR TD PT24: 21.0000 mg | MEDICATED_PATCH | TRANSDERMAL | 0 refills | Status: AC

## 2018-08-06 MED ORDER — LISINOPRIL 20 MG PO TABS: 20.0000 mg | ORAL_TABLET | Freq: Every day | ORAL | 3 refills | Status: DC

## 2018-08-06 MED ORDER — ASPIRIN 81 MG PO TBEC: 81.0000 mg | DELAYED_RELEASE_TABLET | Freq: Every day | ORAL | 0 refills | Status: AC

## 2018-08-06 NOTE — Progress Notes (Signed)
*  PRELIMINARY RESULTS* Echocardiogram 2D Echocardiogram has been performed.  Alex Salazar 08/06/2018, 11:19 AM

## 2018-08-06 NOTE — Evaluation (Addendum)
Physical Therapy Evaluation Patient Details Name: Alex Salazar MRN: [id number] DOB: [birth year] Today's Date: 08/06/2018   History of Present Illness  Alex Salazar is a 73 y.o. male has been having intermittent symptoms of left leg and left arm numbness for the past 4 weeks.  He was afraid to come to the emergency department because of COVID-19.  He says that his symptoms have been intermittently but in the past 2 to 3 days his symptoms have worsened and become constant.  His symptoms did not go away which made him worry.  He came to the emergency department today because he felt increasing left leg numbness and weakness and felt concerned that his left leg would give out.  He has hypertension diabetes, hyperlipidemia and coronary artery disease. Patient had an MRI and MRA brain performed in the ED that was positive for acute CVA.  He is being admitted for further management.     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  Patient had no loss of balance ambulating in room and hallways and tolerated staying up in chair after therapy.  Plan:  Patient discharged from physical therapy to care of nursing for ambulation daily as tolerated for length of stay.     Follow Up Recommendations No PT follow up    Equipment Recommendations  None recommended by PT    Recommendations for Other Services       Precautions / Restrictions Precautions Precautions: None Restrictions Weight Bearing Restrictions: No      Mobility  Bed Mobility Overal bed mobility: Independent             General bed mobility comments: pt in bathroom on OT arrival  Transfers Overall transfer level: Modified independent Equipment used: none               General transfer comment: increased time  Ambulation/Gait    Distance: 200 feet Assistive device: None Gait Pattern/deviations: WFL(Within Functional Limits);Decreased step length - right;Decreased step length - left;Decreased stride length Gait velocity: slightly decreased   General Gait Details: demonstrates good return for ambulation on level, inclined and declined surfaces without loss of balance  Stairs            Wheelchair Mobility    Modified Rankin (Stroke Patients Only)       Balance Overall balance assessment: No apparent balance deficits (not formally assessed)                                           Pertinent Vitals/Pain Pain Assessment: No/denies pain    Home Living Family/patient expects to be discharged to:: Private residence Living Arrangements: Alone Available Help at Discharge: Family;Friend(s);Available PRN/intermittently Type of Home: House Home Access: Stairs to enter Entrance Stairs-Rails: Right;Left;Can reach both Entrance Stairs-Number of Steps: 5 Home Layout: One level Home Equipment: Walker - 2 wheels;Cane - quad;Cane - single point;Bedside commode      Prior Function Level of Independence: Independent         Comments: independent in ADLs, drives, household and short distanced community ambulator     Hand Dominance   Dominant Hand: Right    Extremity/Trunk Assessment  Upper Extremity Assessment Upper Extremity Assessment: Defer to OT evaluation    Lower Extremity Assessment Lower Extremity Assessment: Overall WFL for tasks assessed    Cervical / Trunk Assessment Cervical / Trunk Assessment: Normal  Communication   Communication: No difficulties  Cognition Arousal/Alertness: Awake/alert Behavior During Therapy: WFL for tasks assessed/performed Overall Cognitive Status: Within Functional Limits for tasks assessed                                        General  Comments      Exercises     Assessment/Plan    PT Assessment Patent does not need any further PT services  PT Problem List         PT Treatment Interventions      PT Goals (Current goals can be found in the Care Plan section)  Acute Rehab PT Goals Patient Stated Goal: return home with spouse to assist PT Goal Formulation: With patient Time For Goal Achievement: 08/06/18 Potential to Achieve Goals: Good    Frequency     Barriers to discharge        Co-evaluation               AM-PAC PT "6 Clicks" Mobility  Outcome Measure Help needed turning from your back to your side while in a flat bed without using bedrails?: None Help needed moving from lying on your back to sitting on the side of a flat bed without using bedrails?: None Help needed moving to and from a bed to a chair (including a wheelchair)?: None Help needed standing up from a chair using your arms (e.g., wheelchair or bedside chair)?: None Help needed to walk in hospital room?: None Help needed climbing 3-5 steps with a railing? : None 6 Click Score: 24    End of Session Equipment Utilized During Treatment: Gait belt Activity Tolerance: Patient tolerated treatment well Patient left: in chair;with call bell/phone within reach Nurse Communication: Mobility status PT Visit Diagnosis: Unsteadiness on feet (R26.81);Other abnormalities of gait and mobility (R26.89);Muscle weakness (generalized) (M62.81)    Time: [id number] PT Time Calculation (min) (ACUTE ONLY): 30 min   Charges:   PT Evaluation $PT Eval Moderate Complexity: 1 Mod PT Treatments $Gait Training: 23-37 mins        11:15 AM, 08/06/18 Ocie BobJames Kairyn Olmeda, MPT Physical Therapist with [facility] [phone] office [phone] mobile phone

## 2018-08-06 NOTE — Progress Notes (Signed)
IV removed, WNL. D/C instructions given to pt. Verbalized understanding. Pt awaiting ride to transport home.

## 2018-08-06 NOTE — Consult Note (Signed)
[facility] [name] A. Gerilyn Pilgrim, MD     [email or web address]          Alex Salazar is an 73 y.o. male.   ASSESSMENT/PLAN: 1.  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  This seems most consistent with a lacunar type infarct.  Risk factors hypertension, diabetes and age.  Recommendation dual antiplatelet agents for 3 to 4 weeks.  Subsequently Plavix continuation is recommended with discontinuation of aspirin.  A statin low-dose is also recommended.  As usual continue with other risk factor modifications including diabetes and hypertensive control.  2.  Significant diabetic neuropathy  Patient is a 73 year old right-handed white male who presented with about a 3-day history of numbness and weakness involving the left upper extremity and left leg.  There may have been some gait ataxia but he denies dizziness, dysarthria or dysphasia.  He denies any visual problems, headaches, palpitation, dyspnea or chest pain.  He apparently has had some issues with tolerating statins although it is unclear if he is tried pravastatin.  He is on Plavix and has been compliant with this.  He has chronic pain of the hands apparently from osteoarthritis especially on the right side.  He tells me that he has problems closing the right hand on account of this.  There is chronic numbness of the feet and legs apparently due to diabetes.  The review of systems otherwise negative.      GENERAL: This is a very pleasant male who is in no acute distress.  HEENT: Neck is supple and appreciated.  ABDOMEN: soft  EXTREMITIES: No edema; there is marked arthritic changes throughout especially involving the hands and knees.  BACK: Normal  SKIN: Normal by inspection.    MENTAL STATUS: Alert and oriented including orientation to his age and the month. Speech, language and cognition are generally intact. Judgment and insight normal.   CRANIAL NERVES: Pupils are equal, round and  reactive to light and accomodation; extra ocular movements are full, there is no significant nystagmus; visual fields are full; upper and lower facial muscles are normal in strength and symmetric, there is no flattening of the nasolabial folds; tongue is midline; uvula is midline; shoulder elevation is normal.  MOTOR: There is mild handgrip weakness on the right due to more marked arthritic changes.  Otherwise, strength, tone and bulk are normal throughout.  There is no drift of the upper or lower extremities on either side.  COORDINATION: Left finger to nose is normal, right finger to nose is normal, No rest tremor; no intention tremor; no postural tremor; no bradykinesia.  REFLEXES: Deep tendon reflexes are symmetrical and normal.   SENSATION: There is slightly reduced sensation to light touch involving the left hand.  GAIT: Normal.    NIH stroke scale 1.   Blood pressure (!) 177/88, pulse 79, temperature 98.3 F (36.8 C), temperature source Oral, resp. rate 19, height  (1.803 m), weight 100.3 kg, SpO2 98 %.  Past Medical History:  Diagnosis Date  . Diabetes mellitus without complication (HCC)   . Hyperlipidemia   . Hypertension   . PE (pulmonary thromboembolism) (HCC)     Past Surgical History:  Procedure Laterality Date  . BYPASS GRAFT      History reviewed. No pertinent family history.  Social History:  reports that he has been smoking cigarettes. He has been smoking about 0.50 packs per day. He has never used smokeless tobacco. He reports previous alcohol use. He reports that he  does not use drugs.  Allergies:  Allergies  Allergen Reactions  . Penicillins   . Statins     Medications: Prior to Admission medications   Medication Sig Start Date End Date Taking? Authorizing Provider  clopidogrel (PLAVIX) 75 MG tablet Take 75 mg by mouth daily.     Yes [provider]  doxazosin (CARDURA) 4 MG tablet Take 2 mg by mouth at bedtime.    Yes [provider]  fluticasone (FLONASE) 50 MCG/ACT nasal spray Place 1-2 sprays into both nostrils 2 (two) times daily as needed for allergies or rhinitis.   Yes [provider]  glipiZIDE (GLUCOTROL) 5 MG tablet Take 10 mg by mouth 2 (two) times daily before a meal.   Yes [provider]  insulin aspart (NOVOLOG) 100 UNIT/ML injection Inject 25-35 Units into the skin 2 (two) times daily as needed for high blood sugar (Patient gives himself 25-35 units for blood sugar levels over 225).   Yes [provider]  insulin glargine (LANTUS) 100 UNIT/ML injection Inject 25-40 Units into the skin at bedtime. If blood sugars are 180, patient takes only 25 units. Prescribed 40 units nightly   Yes [provider]  lisinopril (ZESTRIL) 10 MG tablet Take 5 mg by mouth daily.    Yes [provider]  loratadine (CLARITIN) 10 MG tablet Take 10 mg by mouth daily.   Yes [provider]  metFORMIN (GLUCOPHAGE-XR) 500 MG 24 hr tablet Take 1,000 mg by mouth 2 (two) times a day.   Yes [provider]  nebivolol (BYSTOLIC) 5 MG tablet Take 5 mg by mouth daily.   Yes [provider]    Scheduled Meds: . aspirin EC  81 mg Oral Daily  . clopidogrel  75 mg Oral Daily  . enoxaparin (LOVENOX) injection  40 mg Subcutaneous Q24H  . insulin aspart  0-9 Units Subcutaneous TID WC   Continuous Infusions: . sodium chloride 35 mL/hr at 08/05/18 1732   PRN Meds:.acetaminophen **OR** acetaminophen (TYLENOL) oral liquid 160 mg/5 mL **OR** acetaminophen, fluticasone, senna-docusate     Results for orders placed or performed during the hospital encounter of 08/05/18 (from the past 48 hour(s))  CBG monitoring, ED     Status: Abnormal   Collection Time: 08/05/18 11:30 AM  Result Value Ref Range   Glucose-Capillary 281 (H) 70 - 99 mg/dL  Ethanol     Status: None   Collection Time: 08/05/18 11:51 AM  Result Value Ref Range   Alcohol, Ethyl (B) <10 <10 mg/dL     Comment: (NOTE) Lowest detectable limit for serum alcohol is 10 mg/dL. For medical purposes only. Performed at [facility], [address] [place], [place] [address]   Protime-INR     Status: None   Collection Time: 08/05/18 11:51 AM  Result Value Ref Range   Prothrombin Time 13.1 11.4 - 15.2 seconds   INR 1.0 0.8 - 1.2    Comment: (NOTE) INR goal varies based on device and disease states. Performed at [facility], [address] [place], [place] [address]   APTT     Status: None   Collection Time: 08/05/18 11:51 AM  Result Value Ref Range   aPTT 27 24 - 36 seconds    Comment: Performed at [facility], [address] [place], [place] [address]  CBC     Status: None   Collection Time: 08/05/18 11:51 AM  Result Value Ref Range   WBC 10.2 4.0 - 10.5 K/uL  RBC 5.52 4.22 - 5.81 MIL/uL   Hemoglobin 16.9 13.0 - 17.0 g/dL   HCT 02.7 25.3 - 66.4 %   MCV 91.8 80.0 - 100.0 fL   MCH 30.6 26.0 - 34.0 pg   MCHC 33.3 30.0 - 36.0 g/dL   RDW 40.3 47.4 - 25.9 %   Platelets 151 150 - 400 K/uL   nRBC 0.0 0.0 - 0.2 %    Comment: Performed at [facility], [address], [place], [place] [address]  Differential     Status: None   Collection Time: 08/05/18 11:51 AM  Result Value Ref Range   Neutrophils Relative % 70 %   Neutro Abs 7.0 1.7 - 7.7 K/uL   Lymphocytes Relative 23 %   Lymphs Abs 2.3 0.7 - 4.0 K/uL   Monocytes Relative 5 %   Monocytes Absolute 0.6 0.1 - 1.0 K/uL   Eosinophils Relative 1 %   Eosinophils Absolute 0.1 0.0 - 0.5 K/uL   Basophils Relative 1 %   Basophils Absolute 0.1 0.0 - 0.1 K/uL   Immature Granulocytes 0 %   Abs Immature Granulocytes 0.04 0.00 - 0.07 K/uL    Comment: Performed at [facility], [address] [place], [place] [address]  Comprehensive metabolic panel     Status: Abnormal   Collection Time: 08/05/18 11:51 AM  Result Value Ref Range   Sodium 137 135 - 145 mmol/L   Potassium 3.8 3.5 - 5.1 mmol/L   Chloride 99 98 - 111 mmol/L   CO2 25 22 - 32  mmol/L   Glucose, Bld 275 (H) 70 - 99 mg/dL   BUN 15 8 - 23 mg/dL   Creatinine, Ser 2.95 0.61 - 1.24 mg/dL   Calcium 9.5 8.9 - 18.8 mg/dL   Total Protein 7.2 6.5 - 8.1 g/dL   Albumin 3.4 (L) 3.5 - 5.0 g/dL   AST 27 15 - 41 U/L   ALT 31 0 - 44 U/L   Alkaline Phosphatase 76 38 - 126 U/L   Total Bilirubin 0.5 0.3 - 1.2 mg/dL   GFR calc non Af Amer >60 >60 mL/min   GFR calc Af Amer >60 >60 mL/min   Anion gap 13 5 - 15    Comment: Performed at [facility], [address], [place], [place] [address]  Hemoglobin A1c     Status: Abnormal   Collection Time: 08/05/18 11:51 AM  Result Value Ref Range   Hgb A1c MFr Bld 8.1 (H) 4.8 - 5.6 %    Comment: (NOTE) Pre diabetes:          5.7%-6.4% Diabetes:              >6.4% Glycemic control for   <7.0% adults with diabetes    Mean Plasma Glucose 185.77 mg/dL    Comment: Performed at [facility], [address] [place], [place] [address]  SARS Coronavirus 2 (CEPHEID - Performed in [facility] hospital lab), Hosp Order     Status: None   Collection Time: 08/05/18 12:14 PM  Result Value Ref Range   SARS Coronavirus 2 NEGATIVE NEGATIVE    Comment: (NOTE) If result is NEGATIVE SARS-CoV-2 target nucleic acids are NOT DETECTED. The SARS-CoV-2 RNA is generally detectable in upper and lower  respiratory specimens during the acute phase of infection. The lowest  concentration of SARS-CoV-2 viral copies this assay can detect is 250  copies / mL. A negative result does not preclude SARS-CoV-2 infection  and should not be  used as the sole basis for treatment or other  patient management decisions.  A negative result may occur with  improper specimen collection / handling, submission of specimen other  than nasopharyngeal swab, presence of viral mutation(s) within the  areas targeted by this assay, and inadequate number of viral copies  (<250 copies / mL). A negative result must be combined with clinical  observations, patient history, and  epidemiological information. If result is POSITIVE SARS-CoV-2 target nucleic acids are DETECTED. The SARS-CoV-2 RNA is generally detectable in upper and lower  respiratory specimens dur ing the acute phase of infection.  Positive  results are indicative of active infection with SARS-CoV-2.  Clinical  correlation with patient history and other diagnostic information is  necessary to determine patient infection status.  Positive results do  not rule out bacterial infection or co-infection with other viruses. If result is PRESUMPTIVE POSTIVE SARS-CoV-2 nucleic acids MAY BE PRESENT.   A presumptive positive result was obtained on the submitted specimen  and confirmed on repeat testing.  While 2019 novel coronavirus  (SARS-CoV-2) nucleic acids may be present in the submitted sample  additional confirmatory testing may be necessary for epidemiological  and / or clinical management purposes  to differentiate between  SARS-CoV-2 and other Sarbecovirus currently known to infect humans.  If clinically indicated additional testing with an alternate test  methodology [phone]) is advised. The SARS-CoV-2 RNA is generally  detectable in upper and lower respiratory sp ecimens during the acute  phase of infection. The expected result is Negative. Fact Sheet for Patients:  BoilerBrush.com.cy Fact Sheet for Healthcare Providers: https://pope.com/ This test is not yet approved or cleared by the [place] FDA and has been authorized for detection and/or diagnosis of SARS-CoV-2 by FDA under an Emergency Use Authorization (EUA).  This EUA will remain in effect (meaning this test can be used) for the duration of the COVID-19 declaration under Section 564(b)(1) of the Act, 21 U.S.C. section 360bbb-3(b)(1), unless the authorization is terminated or revoked sooner. Performed at [facility], [address] [place], [place] [address]   Urine rapid drug  screen (hosp performed)     Status: None   Collection Time: 08/05/18  2:08 PM  Result Value Ref Range   Opiates NONE DETECTED NONE DETECTED   Cocaine NONE DETECTED NONE DETECTED   Benzodiazepines NONE DETECTED NONE DETECTED   Amphetamines NONE DETECTED NONE DETECTED   Tetrahydrocannabinol NONE DETECTED NONE DETECTED   Barbiturates NONE DETECTED NONE DETECTED    Comment: (NOTE) DRUG SCREEN FOR MEDICAL PURPOSES ONLY.  IF CONFIRMATION IS NEEDED FOR ANY PURPOSE, NOTIFY LAB WITHIN 5 DAYS. LOWEST DETECTABLE LIMITS FOR URINE DRUG SCREEN Drug Class                     Cutoff (ng/mL) Amphetamine and metabolites    1000 Barbiturate and metabolites    200 Benzodiazepine                 200 Tricyclics and metabolites     300 Opiates and metabolites        300 Cocaine and metabolites        300 THC                            50 Performed at [facility], [address] [place], [place] [address]   Urinalysis, Routine w reflex microscopic     Status: Abnormal   Collection Time:  08/05/18  2:08 PM  Result Value Ref Range   Color, Urine YELLOW YELLOW   APPearance CLEAR CLEAR   Specific Gravity, Urine 1.016 1.005 - 1.030   pH 6.0 5.0 - 8.0   Glucose, UA >=500 (A) NEGATIVE mg/dL   Hgb urine dipstick MODERATE (A) NEGATIVE   Bilirubin Urine NEGATIVE NEGATIVE   Ketones, ur NEGATIVE NEGATIVE mg/dL   Protein, ur 914 (A) NEGATIVE mg/dL   Nitrite NEGATIVE NEGATIVE   Leukocytes,Ua NEGATIVE NEGATIVE   RBC / HPF 6-10 0 - 5 RBC/hpf   WBC, UA 0-5 0 - 5 WBC/hpf   Bacteria, UA NONE SEEN NONE SEEN   Hyaline Casts, UA PRESENT     Comment: Performed at [facility], [address], [place], [place] [address]  CBG monitoring, ED     Status: Abnormal   Collection Time: 08/05/18  2:22 PM  Result Value Ref Range   Glucose-Capillary 151 (H) 70 - 99 mg/dL  Glucose, capillary     Status: Abnormal   Collection Time: 08/05/18  4:35 PM  Result Value Ref Range   Glucose-Capillary 114 (H) 70 - 99 mg/dL  Glucose,  capillary     Status: Abnormal   Collection Time: 08/05/18  9:59 PM  Result Value Ref Range   Glucose-Capillary 164 (H) 70 - 99 mg/dL   Comment 1 Notify RN    Comment 2 Document in Chart   Lipid panel     Status: Abnormal   Collection Time: 08/06/18  4:31 AM  Result Value Ref Range   Cholesterol 179 0 - 200 mg/dL   Triglycerides 621 (H) <150 mg/dL   HDL 25 (L) >30 mg/dL   Total CHOL/HDL Ratio 7.2 RATIO   VLDL 49 (H) 0 - 40 mg/dL   LDL Cholesterol 865 (H) 0 - 99 mg/dL    Comment:        Total Cholesterol/HDL:CHD Risk Coronary Heart Disease Risk Table                     Men   Women  1/2 Average Risk   3.4   3.3  Average Risk       5.0   4.4  2 X Average Risk   9.6   7.1  3 X Average Risk  23.4   11.0        Use the calculated Patient Ratio above and the CHD Risk Table to determine the patient's CHD Risk.        ATP III CLASSIFICATION (LDL):  <100     mg/dL   Optimal  784-696  mg/dL   Near or Above                    Optimal  130-159  mg/dL   Borderline  295-284  mg/dL   High  >132     mg/dL   Very High Performed at [facility], [address] [place], [place] [address]   Comprehensive metabolic panel     Status: Abnormal   Collection Time: 08/06/18  4:31 AM  Result Value Ref Range   Sodium 137 135 - 145 mmol/L   Potassium 4.1 3.5 - 5.1 mmol/L   Chloride 102 98 - 111 mmol/L   CO2 24 22 - 32 mmol/L   Glucose, Bld 230 (H) 70 - 99 mg/dL   BUN 16 8 - 23 mg/dL   Creatinine, Ser 2.72 0.61 - 1.24 mg/dL   Calcium 9.4 8.9 - 53.6 mg/dL  Total Protein 7.0 6.5 - 8.1 g/dL   Albumin 3.3 (L) 3.5 - 5.0 g/dL   AST 19 15 - 41 U/L   ALT 27 0 - 44 U/L   Alkaline Phosphatase 69 38 - 126 U/L   Total Bilirubin 0.7 0.3 - 1.2 mg/dL   GFR calc non Af Amer >60 >60 mL/min   GFR calc Af Amer >60 >60 mL/min   Anion gap 11 5 - 15    Comment: Performed at [facility], [address], [place], [place] [address]  CBC     Status: Abnormal   Collection Time: 08/06/18  4:31 AM  Result Value  Ref Range   WBC 12.3 (H) 4.0 - 10.5 K/uL   RBC 5.47 4.22 - 5.81 MIL/uL   Hemoglobin 16.7 13.0 - 17.0 g/dL   HCT 60.4 54.0 - 98.1 %   MCV 92.5 80.0 - 100.0 fL   MCH 30.5 26.0 - 34.0 pg   MCHC 33.0 30.0 - 36.0 g/dL   RDW 19.1 47.8 - 29.5 %   Platelets 158 150 - 400 K/uL   nRBC 0.0 0.0 - 0.2 %    Comment: Performed at [facility], [address] [place], [place] [address]  Glucose, capillary     Status: Abnormal   Collection Time: 08/06/18  7:29 AM  Result Value Ref Range   Glucose-Capillary 259 (H) 70 - 99 mg/dL    Studies/Results:  MRA MRI BRAIN FINDINGS: MRI HEAD FINDINGS  BRAIN: Small acute infarct of the posterior aspect of the right lentiform nucleus and tail of the caudate. There is a small, old infarct of the right thalamus. The midline structures are normal. The white matter signal is normal for the patient's age. The CSF spaces are normal for age, with no hydrocephalus. Susceptibility-sensitive sequences show no chronic microhemorrhage or superficial siderosis.  SKULL AND UPPER CERVICAL SPINE: The visualized skull base, calvarium, upper cervical spine and extracranial soft tissues are normal.  SINUSES/ORBITS: No fluid levels or advanced mucosal thickening. No mastoid or middle ear effusion. The orbits are normal.  MRA HEAD FINDINGS  POSTERIOR CIRCULATION:  --Vertebral arteries: Normal codominant configuration of V4 segments.  --Posterior inferior cerebellar arteries (PICA): Patent origins from the vertebral arteries.  --Anterior inferior cerebellar arteries (AICA): Patent origins from the basilar artery.  --Basilar artery: Normal.  --Superior cerebellar arteries: Normal.  --Posterior cerebral arteries (PCA): Normal. Both originate from the basilar artery. Posterior communicating arteries (p-comm) are diminutive or absent.  ANTERIOR CIRCULATION:  --Intracranial internal carotid arteries: Normal.  --Anterior cerebral arteries (ACA):  Normal. Hypoplastic right A1 segment, normal variant.  --Middle cerebral arteries (MCA): The right MCA M2 branches are attenuated, but this may be exaggerated by motion. Normal left MCA.  IMPRESSION: 1. Small acute infarct of the right basal ganglia without hemorrhage or mass effect. This location affects the right internal capsule and is in keeping with the reported left-sided symptoms. 2. No large vessel occlusion. 3. Narrowed appearance of the right MCA M2 branches is probably exaggerated by motion artifact.   The MRI and MRA are both reviewed in person.  There is a moderate size infarct involving the putamen and the associated white matter structures including the insular area on the right side.  There is otherwise minimal white matter chronic ischemic changes.  There is a large brightness in the occipital horns of the lateral ventricle bilaterally consistent with calcified choroid plexus.  There is a tiny lacunar infarct involving the right thalamic region which is chronic.  MRA shows mild luminal irregularity involving the A2 segment of the MCA on the right side and the right PCA.  Michaelangelo Mittelman A. Gerilyn Pilgrim, M.D.  Diplomate, Biomedical engineer of Psychiatry and Neurology ( Neurology). 08/06/2018, 8:08 AM

## 2018-08-06 NOTE — Discharge Instructions (Signed)
1)Continue Plavix 75 mg daily for Heart attack and Stroke Prevention, however over the next 4 weeks you will also take aspirin 81 mg daily with food in addition to your Plavix... After 4 weeks he will stop the aspirin and take the Plavix alone without aspirin 2) since you are taking Plavix and aspirin please Avoid ibuprofen/Advil/Aleve/Motrin/Goody Powders/Naproxen/BC powders/Meloxicam/Diclofenac/Indomethacin and other Nonsteroidal anti-inflammatory medications as these will make you more likely to bleed and can cause stomach ulcers, can also cause Kidney problems.  3) smoking cigarettes significantly increases your risk for another stroke and heart attack--- complete cessation of smoking and strongly advised--- okay to use nicotine patches 4) please talk to your [facility] physician about getting insurance approval for cholesterol medicines that and not statin based.... In the meantime please take very low-dose pravastatin 10 mg daily to help reduce your risk for stroke and heart attacks 5) your diabetes and blood pressure are not well controlled.... This puts you at risk for another stroke and heart attack--- okay to stop glipizide, your insulin dosage needs to be adjusted further.  Please keep a glucose/diabetic diary and take this with you to your appointment with your [facility] doctor so that your insulin can be adjusted 6) follow-up with your [facility] physician within 1 to 2 weeks as advised

## 2018-08-06 NOTE — Evaluation (Signed)
Occupational Therapy Evaluation Patient Details Name: Alex Salazar MRN: [id number] DOB: Jul 16, 1945 Today's Date: 08/06/2018    History of Present Illness Alex Salazar is a 73 y.o. male has been having intermittent symptoms of left leg and left arm numbness for the past 4 weeks.  He was afraid to come to the emergency department because of COVID-19.  He says that his symptoms have been intermittently but in the past 2 to 3 days his symptoms have worsened and become constant.  His symptoms did not go away which made him worry.  He came to the emergency department today because he felt increasing left leg numbness and weakness and felt concerned that his left leg would give out.  He has hypertension diabetes, hyperlipidemia and coronary artery disease. Patient had an MRI and MRA brain performed in the ED that was positive for acute CVA.  He is being admitted for further management.    Clinical Impression   Pt up performing ADLs on OT arrival. Pt demonstrates independent in ADL completion, using standard walker at request of nsg staff however lifting and carrying it versus using it for balance. Pt demonstrates BUE strength WNL, coordination and sensation are intact. Pt reports LUE sensation is slightly dull, light touch sensation is intact. Pt is performing ADL at baseline independence, no further OT services are required at this time.     Follow Up Recommendations  No OT follow up    Equipment Recommendations  None recommended by OT       Precautions / Restrictions Precautions Precautions: None Restrictions Weight Bearing Restrictions: No      Mobility Bed Mobility               General bed mobility comments: pt in bathroom on OT arrival  Transfers Overall transfer level: Modified independent Equipment used: Standard walker                      ADL either performed or assessed with clinical judgement   ADL Overall ADL's : Needs assistance/impaired      Grooming: Wash/dry hands;Modified independent;Standing                   Toilet Transfer: Modified Independent;Ambulation;Regular Toilet   Toileting- Clothing Manipulation and Hygiene: Modified independent;Sitting/lateral lean;Sit to/from stand       Functional mobility during ADLs: Supervision/safety;Rolling walker       Vision Baseline Vision/History: Wears glasses Wears Glasses: At all times Patient Visual Report: No change from baseline Vision Assessment?: No apparent visual deficits            Pertinent Vitals/Pain Pain Assessment: No/denies pain     Hand Dominance Right   Extremity/Trunk Assessment Upper Extremity Assessment Upper Extremity Assessment: Overall WFL for tasks assessed(BUE strength 4/5-4+/5)   Lower Extremity Assessment Lower Extremity Assessment: Defer to PT evaluation   Cervical / Trunk Assessment Cervical / Trunk Assessment: Normal   Communication Communication Communication: No difficulties   Cognition Arousal/Alertness: Awake/alert Behavior During Therapy: WFL for tasks assessed/performed Overall Cognitive Status: Within Functional Limits for tasks assessed                                                Home Living Family/patient expects to be discharged to:: Private residence Living Arrangements: Alone Available Help at Discharge: Friend(s);Available PRN/intermittently Type of Home: House  Home Access: Stairs to enter Entrance Stairs-Number of Steps: 5 Entrance Stairs-Rails: Right;Left;Can reach both Home Layout: One level     Bathroom Shower/Tub: Chief Strategy Officer: Standard     Home Equipment: Environmental consultant - 2 wheels;Cane - quad;Cane - single point;Bedside commode      Lives With: Alone    Prior Functioning/Environment Level of Independence: Independent        Comments: independent in ADLs, drives        OT Problem List: Impaired sensation       End of Session Equipment  Utilized During Treatment: Rolling walker Nurse Communication: Mobility status;Other (comment)(chair alarm off)  Activity Tolerance: Patient tolerated treatment well Patient left: in chair;with call bell/phone within reach  OT Visit Diagnosis: Muscle weakness (generalized) (M62.81)                Time: 9983-3825 OT Time Calculation (min): 17 min Charges:  OT General Charges $OT Visit: 1 Visit OT Evaluation $OT Eval Low Complexity: 1 Low  Ezra Sites, OTR/L  [phone] 08/06/2018, 8:16 AM

## 2018-08-06 NOTE — Discharge Summary (Signed)
Alex Salazar, is a 73 y.o. male  DOB [birth year]  MRN [id number].  Admission date:  08/05/2018  Admitting Physician  Cleora Fleetlanford L Johnson, MD  Discharge Date:  08/06/2018   Primary MD  Delorse LekBurnett, Brent A, MD  Recommendations for primary care physician for things to follow:   1)Continue Plavix 75 mg daily for Heart attack and Stroke Prevention, however over the next 4 weeks you will also take aspirin 81 mg daily with food in addition to your Plavix... After 4 weeks he will stop the aspirin and take the Plavix alone without aspirin 2) since you are taking Plavix and aspirin please Avoid ibuprofen/Advil/Aleve/Motrin/Goody Powders/Naproxen/BC powders/Meloxicam/Diclofenac/Indomethacin and other Nonsteroidal anti-inflammatory medications as these will make you more likely to bleed and can cause stomach ulcers, can also cause Kidney problems.  3) smoking cigarettes significantly increases your risk for another stroke and heart attack--- complete cessation of smoking and strongly advised--- okay to use nicotine patches 4) please talk to your [facility] physician about getting insurance approval for cholesterol medicines that and not statin based.... In the meantime please take very low-dose pravastatin 10 mg daily to help reduce your risk for stroke and heart attacks 5) your diabetes and blood pressure are not well controlled.... This puts you at risk for another stroke and heart attack--- okay to stop glipizide, your insulin dosage needs to be adjusted further.  Please keep a glucose/diabetic diary and take this with you to your appointment with your [facility] doctor so that your insulin can be adjusted 6) follow-up with your [facility] physician within 1 to 2 weeks as advised   Admission Diagnosis  Tobacco abuse [Z72.0] Hyperglycemia [R73.9] Cerebrovascular accident (CVA), unspecified mechanism (HCC) [I63.9] Acute CVA (cerebrovascular  accident) (HCC) [I63.9]   Discharge Diagnosis  Tobacco abuse [Z72.0] Hyperglycemia [R73.9] Cerebrovascular accident (CVA), unspecified mechanism (HCC) [I63.9] Acute CVA (cerebrovascular accident) (HCC) [I63.9]    Principal Problem:   Acute CVA (cerebrovascular accident) (HCC) Active Problems:   Difficulty walking   Left leg weakness   Hyperlipidemia   Diabetes mellitus without complication (HCC)   Hypertension   Tobacco abuse      Past Medical History:  Diagnosis Date   Diabetes mellitus without complication (HCC)    Hyperlipidemia    Hypertension    PE (pulmonary thromboembolism) (HCC)     Past Surgical History:  Procedure Laterality Date   BYPASS GRAFT         HPI  from the history and physical done on the day of admission:    Chief Complaint: left leg weakness   HPI: Alex Salazar is a 73 y.o. male has been having intermittent symptoms of left leg and left arm numbness for the past 4 weeks.  He was afraid to come to the emergency department because of COVID-19.  He says that his symptoms have been intermittently but in the past 2 to 3 days his symptoms have worsened and become constant.  His symptoms did not go away which made him worry.  He came to  the emergency department today because he felt increasing left leg numbness and weakness and felt concerned that his left leg would give out.  He has hypertension diabetes, hyperlipidemia and coronary artery disease.  ED course: Patient had an MRI and MRA brain performed in the ED that was positive for acute CVA.  He is being admitted for further management.    Hospital Course:        1)Acute CVA/Acute left hemiparesis due to infarct involving the right basal ganglia- MRI suggests right basal ganglial infarct which is acute.    Neurology consult appreciated, echocardiogram with preserved EF over 60%, no intracardiac thrombus, carotid artery Dopplers without hemodynamically significant stenosis, patient was  already taking Plavix for CAD, he will take aspirin 81 mg daily along with Plavix for 4 weeks after 4 weeks we will stop the aspirin and continue Plavix... He is apparently unable to tolerate statin drugs.... He will talk to his [place] physician about possibly getting Repatha approved..his LDL is above 100 and his HDL is only 25  2)CAD - continue plavix, added aspirin for secondary stroke prevention.   3)DM2-----A1c 8.1, reflecting poor diabetic control, patient will talk to PCP about adjusting insulin regimen to achieve better diabetic control .Marland Kitchen Please see discharge instructions regarding diabetic management .Marland Kitchen... Patient has diabetic neuropathy  4) tobacco abuse--- smoking cessation strongly advised in view of CAD and stroke, patient verbalized understanding of the need to quit smoking, okay to use nicotine patch  5)HTN--- compliance with BP meds strongly encouraged,  Discharge Condition: Stable  Follow UP--- PCP at [facility] to get approval for Repatha and for better diabetic and hypertension management    Consults obtained -neurology  Diet and Activity recommendation:  As advised  Discharge Instructions     Discharge Instructions    Call MD for:  difficulty breathing, headache or visual disturbances   Complete by:  As directed    Call MD for:  persistant dizziness or light-headedness   Complete by:  As directed    Call MD for:  persistant nausea and vomiting   Complete by:  As directed    Call MD for:  severe uncontrolled pain   Complete by:  As directed    Call MD for:  temperature >100.4   Complete by:  As directed    Diet - low sodium heart healthy   Complete by:  As directed    Diet Carb Modified   Complete by:  As directed    Discharge instructions   Complete by:  As directed    1)Continue Plavix 75 mg daily for Heart attack and Stroke Prevention, however over the next 4 weeks you will also take aspirin 81 mg daily with food in addition to your Plavix... After 4 weeks  he will stop the aspirin and take the Plavix alone without aspirin 2) since you are taking Plavix and aspirin please Avoid ibuprofen/Advil/Aleve/Motrin/Goody Powders/Naproxen/BC powders/Meloxicam/Diclofenac/Indomethacin and other Nonsteroidal anti-inflammatory medications as these will make you more likely to bleed and can cause stomach ulcers, can also cause Kidney problems.  3) smoking cigarettes significantly increases your risk for another stroke and heart attack--- complete cessation of smoking and strongly advised--- okay to use nicotine patches 4) please talk to your [facility] physician about getting insurance approval for cholesterol medicines that and not statin based.... In the meantime please take very low-dose pravastatin 10 mg daily to help reduce your risk for stroke and heart attacks 5) your diabetes and blood pressure are not well controlled.Marland KitchenMarland KitchenMarland Kitchen  This puts you at risk for another stroke and heart attack--- okay to stop glipizide, your insulin dosage needs to be adjusted further.  Please keep a glucose/diabetic diary and take this with you to your appointment with your [facility] doctor so that your insulin can be adjusted 6) follow-up with your VA physician within 1 to 2 weeks as advised   Increase activity slowly   Complete by:  As directed         Discharge Medications     Allergies as of 08/06/2018      Reactions   Penicillins    Statins       Medication List    STOP taking these medications   glipiZIDE 5 MG tablet Commonly known as:  GLUCOTROL     TAKE these medications   aspirin 81 MG EC tablet Take 1 tablet (81 mg total) by mouth daily with breakfast. Take for 4 weeks only, then STOP   clopidogrel 75 MG tablet Commonly known as:  PLAVIX Take 75 mg by mouth daily.   doxazosin 4 MG tablet Commonly known as:  CARDURA Take 2 mg by mouth at bedtime.   fluticasone 50 MCG/ACT nasal spray Commonly known as:  FLONASE Place 1-2 sprays into both nostrils 2 (two) times daily as  needed for allergies or rhinitis.   insulin aspart 100 UNIT/ML injection Commonly known as:  novoLOG Inject 25-35 Units into the skin 2 (two) times daily as needed for high blood sugar (Patient gives himself 25-35 units for blood sugar levels over 225).   insulin glargine 100 UNIT/ML injection Commonly known as:  LANTUS Inject 25-40 Units into the skin at bedtime. If blood sugars are 180, patient takes only 25 units. Prescribed 40 units nightly   lisinopril 20 MG tablet Commonly known as:  ZESTRIL Take 1 tablet (20 mg total) by mouth daily. What changed:    medication strength  how much to take   loratadine 10 MG tablet Commonly known as:  CLARITIN Take 10 mg by mouth daily.   metFORMIN 500 MG 24 hr tablet Commonly known as:  GLUCOPHAGE-XR Take 1,000 mg by mouth 2 (two) times a day.   nebivolol 5 MG tablet Commonly known as:  BYSTOLIC Take 5 mg by mouth daily.   nicotine 21 mg/24hr patch Commonly known as:  NICODERM CQ - dosed in mg/24 hours Place 1 patch (21 mg total) onto the skin daily for 28 days.   pravastatin 10 MG tablet Commonly known as:  Pravachol Take 1 tablet (10 mg total) by mouth every evening. For stroke and heart attack prevention       Major procedures and Radiology Reports - PLEASE review detailed and final reports for all details, in brief -   Ct Head Wo Contrast  Result Date: 08/05/2018 CLINICAL DATA:  Pt c/o periods of left sided weakness and numbness for 3-4wks. This time it has been going on for 2dys.Focal neuro deficit, > 6 hrs, stroke suspected EXAM: CT HEAD WITHOUT CONTRAST TECHNIQUE: Contiguous axial images were obtained from the base of the skull through the vertex without intravenous contrast. COMPARISON:  None. FINDINGS: Brain: No acute intracranial hemorrhage. No focal mass lesion. No CT evidence of acute cortical infarction. No midline shift or mass effect. No hydrocephalus. Basilar cisterns are patent. Hypodensity at the junction of the  posterior limb of the RIGHT internal capsule and lentiform nucleus measures 13 mm x 9 mm (image 16/2). Vascular: No hyperdense vessel or unexpected calcification. Skull: Normal. Negative for  fracture or focal lesion. Sinuses/Orbits: No acute finding. Other: None IMPRESSION: 1. Age-indeterminate infarction in the posterior RIGHT basal ganglia. 2. Mild subcortical white matter microvascular disease. Electronically Signed   By: Genevive Bi M.D.   On: 08/05/2018 12:27   [name] Head Wo Contrast  Result Date: 08/05/2018 CLINICAL DATA:  Left arm and leg numbness EXAM: MRI HEAD WITHOUT CONTRAST MRA HEAD WITHOUT CONTRAST TECHNIQUE: Multiplanar, multiecho pulse sequences of the brain and surrounding structures were obtained without intravenous contrast. Angiographic images of the head were obtained using MRA technique without contrast. COMPARISON:  Head CT 08/05/2018 FINDINGS: MRI HEAD FINDINGS BRAIN: Small acute infarct of the posterior aspect of the right lentiform nucleus and tail of the caudate. There is a small, old infarct of the right thalamus. The midline structures are normal. The white matter signal is normal for the patient's age. The CSF spaces are normal for age, with no hydrocephalus. Susceptibility-sensitive sequences show no chronic microhemorrhage or superficial siderosis. SKULL AND UPPER CERVICAL SPINE: The visualized skull base, calvarium, upper cervical spine and extracranial soft tissues are normal. SINUSES/ORBITS: No fluid levels or advanced mucosal thickening. No mastoid or middle ear effusion. The orbits are normal. MRA HEAD FINDINGS POSTERIOR CIRCULATION: --Vertebral arteries: Normal codominant configuration of V4 segments. --Posterior inferior cerebellar arteries (PICA): Patent origins from the vertebral arteries. --Anterior inferior cerebellar arteries (AICA): Patent origins from the basilar artery. --Basilar artery: Normal. --Superior cerebellar arteries: Normal. --Posterior cerebral  arteries (PCA): Normal. Both originate from the basilar artery. Posterior communicating arteries (p-comm) are diminutive or absent. ANTERIOR CIRCULATION: --Intracranial internal carotid arteries: Normal. --Anterior cerebral arteries (ACA): Normal. Hypoplastic right A1 segment, normal variant. --Middle cerebral arteries (MCA): The right MCA M2 branches are attenuated, but this may be exaggerated by motion. Normal left MCA. IMPRESSION: 1. Small acute infarct of the right basal ganglia without hemorrhage or mass effect. This location affects the right internal capsule and is in keeping with the reported left-sided symptoms. 2. No large vessel occlusion. 3. Narrowed appearance of the right MCA M2 branches is probably exaggerated by motion artifact. Electronically Signed   By: Deatra Robinson M.D.   On: 08/05/2018 14:04   Mr Brain Wo Contrast  Result Date: 08/05/2018 CLINICAL DATA:  Left arm and leg numbness EXAM: MRI HEAD WITHOUT CONTRAST MRA HEAD WITHOUT CONTRAST TECHNIQUE: Multiplanar, multiecho pulse sequences of the brain and surrounding structures were obtained without intravenous contrast. Angiographic images of the head were obtained using MRA technique without contrast. COMPARISON:  Head CT 08/05/2018 FINDINGS: MRI HEAD FINDINGS BRAIN: Small acute infarct of the posterior aspect of the right lentiform nucleus and tail of the caudate. There is a small, old infarct of the right thalamus. The midline structures are normal. The white matter signal is normal for the patient's age. The CSF spaces are normal for age, with no hydrocephalus. Susceptibility-sensitive sequences show no chronic microhemorrhage or superficial siderosis. SKULL AND UPPER CERVICAL SPINE: The visualized skull base, calvarium, upper cervical spine and extracranial soft tissues are normal. SINUSES/ORBITS: No fluid levels or advanced mucosal thickening. No mastoid or middle ear effusion. The orbits are normal. MRA HEAD FINDINGS POSTERIOR  CIRCULATION: --Vertebral arteries: Normal codominant configuration of V4 segments. --Posterior inferior cerebellar arteries (PICA): Patent origins from the vertebral arteries. --Anterior inferior cerebellar arteries (AICA): Patent origins from the basilar artery. --Basilar artery: Normal. --Superior cerebellar arteries: Normal. --Posterior cerebral arteries (PCA): Normal. Both originate from the basilar artery. Posterior communicating arteries (p-comm) are diminutive or absent. ANTERIOR CIRCULATION: --Intracranial internal  carotid arteries: Normal. --Anterior cerebral arteries (ACA): Normal. Hypoplastic right A1 segment, normal variant. --Middle cerebral arteries (MCA): The right MCA M2 branches are attenuated, but this may be exaggerated by motion. Normal left MCA. IMPRESSION: 1. Small acute infarct of the right basal ganglia without hemorrhage or mass effect. This location affects the right internal capsule and is in keeping with the reported left-sided symptoms. 2. No large vessel occlusion. 3. Narrowed appearance of the right MCA M2 branches is probably exaggerated by motion artifact. Electronically Signed   By: Deatra Robinson M.D.   On: 08/05/2018 14:04   US Carotid Bilateral (at [facility] Only)  Result Date: 08/06/2018 CLINICAL DATA:  Stroke at admission yesterday. History of CAD, hypertension, hyperlipidemia, diabetes and smoking. EXAM: BILATERAL CAROTID DUPLEX ULTRASOUND TECHNIQUE: Wallace Cullens scale imaging, color Doppler and duplex ultrasound were performed of bilateral carotid and vertebral arteries in the neck. COMPARISON:  None. FINDINGS: Criteria: Quantification of carotid stenosis is based on velocity parameters that correlate the residual internal carotid diameter with NASCET-based stenosis levels, using the diameter of the distal internal carotid lumen as the denominator for stenosis measurement. The following velocity measurements were obtained: RIGHT ICA: 54/14 cm/sec CCA: 100/14 cm/sec SYSTOLIC  ICA/CCA RATIO:  0.6 ECA: 108 cm/sec LEFT ICA: 61/19 cm/sec CCA: 93/14 cm/sec SYSTOLIC ICA/CCA RATIO:  0.7 ECA: 499 cm/sec RIGHT CAROTID ARTERY: There is a moderate amount of eccentric mixed echogenic plaque within the right carotid bulb (images 14 and 16), extending to involve the origin and proximal aspects of the right internal carotid artery (image 25), not resulting in elevated peak systolic velocities within the interrogated course the right internal carotid artery to suggest a hemodynamically significant stenosis. RIGHT VERTEBRAL ARTERY:  Antegrade flow LEFT CAROTID ARTERY: There is a moderate amount of eccentric mixed echogenic plaque involving the origin and proximal aspects of the left internal carotid artery (image 59 not resulting in elevated peak systolic velocities within the interrogated course of the left internal carotid artery to suggest a hemodynamically significant stenosis. LEFT VERTEBRAL ARTERY:  Antegrade Flow IMPRESSION: Moderate amount of bilateral atherosclerotic plaque, right greater than left, not resulting in a hemodynamically significant stenosis within either internal carotid artery. Electronically Signed   By: Simonne Come M.D.   On: 08/06/2018 15:39    Micro Results    Recent Results (from the past 240 hour(s))  SARS Coronavirus 2 (CEPHEID - Performed in [facility] hospital lab), Hosp Order     Status: None   Collection Time: 08/05/18 12:14 PM  Result Value Ref Range Status   SARS Coronavirus 2 NEGATIVE NEGATIVE Final    Comment: (NOTE) If result is NEGATIVE SARS-CoV-2 target nucleic acids are NOT DETECTED. The SARS-CoV-2 RNA is generally detectable in upper and lower  respiratory specimens during the acute phase of infection. The lowest  concentration of SARS-CoV-2 viral copies this assay can detect is 250  copies / mL. A negative result does not preclude SARS-CoV-2 infection  and should not be used as the sole basis for treatment or other  patient management  decisions.  A negative result may occur with  improper specimen collection / handling, submission of specimen other  than nasopharyngeal swab, presence of viral mutation(s) within the  areas targeted by this assay, and inadequate number of viral copies  (<250 copies / mL). A negative result must be combined with clinical  observations, patient history, and epidemiological information. If result is POSITIVE SARS-CoV-2 target nucleic acids are DETECTED. The SARS-CoV-2 RNA is generally  detectable in upper and lower  respiratory specimens dur ing the acute phase of infection.  Positive  results are indicative of active infection with SARS-CoV-2.  Clinical  correlation with patient history and other diagnostic information is  necessary to determine patient infection status.  Positive results do  not rule out bacterial infection or co-infection with other viruses. If result is PRESUMPTIVE POSTIVE SARS-CoV-2 nucleic acids MAY BE PRESENT.   A presumptive positive result was obtained on the submitted specimen  and confirmed on repeat testing.  While 2019 novel coronavirus  (SARS-CoV-2) nucleic acids may be present in the submitted sample  additional confirmatory testing may be necessary for epidemiological  and / or clinical management purposes  to differentiate between  SARS-CoV-2 and other Sarbecovirus currently known to infect humans.  If clinically indicated additional testing with an alternate test  methodology [id number]) is advised. The SARS-CoV-2 RNA is generally  detectable in upper and lower respiratory sp ecimens during the acute  phase of infection. The expected result is Negative. Fact Sheet for Patients:  BoilerBrush.com.cy Fact Sheet for Healthcare Providers: https://pope.com/ This test is not yet approved or cleared by the [place] FDA and has been authorized for detection and/or diagnosis of SARS-CoV-2 by FDA under an  Emergency Use Authorization (EUA).  This EUA will remain in effect (meaning this test can be used) for the duration of the COVID-19 declaration under Section 564(b)(1) of the Act, 21 U.S.C. section 360bbb-3(b)(1), unless the authorization is terminated or revoked sooner. Performed at [facility], [address] [place], [place] [address]     Today   Subjective    Alex Salazar today has no new complaints, No fever  Or chills , no chest pains no palpitations no dizziness no shortness of breath          Patient has been seen and examined prior to discharge   Objective   Blood pressure (!) 164/69, pulse 80, temperature 97.9 F (36.6 C), temperature source Oral, resp. rate 19, height  (1.803 m), weight 100.3 kg, SpO2 99 %.   Intake/Output Summary (Last 24 hours) at 08/06/2018 1706 Last data filed at 08/06/2018 1316 Gross per 24 hour  Intake 1173.92 ml  Output --  Net 1173.92 ml    Exam Gen:- Awake Alert, no acute distress  HEENT:- [place].AT, No sclera icterus Neck-Supple Neck,No JVD,.  Lungs-  CTAB , good air movement bilaterally  CV- S1, S2 normal, regular, prior sternotomy scar Abd-  +ve B.Sounds, Abd Soft, No tenderness,    Extremity/Skin:- No  edema,   good pulses Psych-affect is appropriate, oriented x3 Neuro-slight sensory deficits with light touch to the left hand otherwise no new significant focal deficits at this time , no tremors    Data Review   CBC w Diff:  Lab Results  Component Value Date   WBC 12.3 (H) 08/06/2018   HGB 16.7 08/06/2018   HCT 50.6 08/06/2018   PLT 158 08/06/2018   LYMPHOPCT 23 08/05/2018   MONOPCT 5 08/05/2018   EOSPCT 1 08/05/2018   BASOPCT 1 08/05/2018    CMP:  Lab Results  Component Value Date   NA 137 08/06/2018   K 4.1 08/06/2018   CL 102 08/06/2018   CO2 24 08/06/2018   BUN 16 08/06/2018   CREATININE 0.92 08/06/2018   PROT 7.0 08/06/2018   ALBUMIN 3.3 (L) 08/06/2018   BILITOT 0.7 08/06/2018   ALKPHOS 69  08/06/2018   AST 19 08/06/2018   ALT 27 08/06/2018  .  Total Discharge time is about 33 minutes  Shon Hale M.D on 08/06/2018 at 5:06 PM  Go to [email or web address] -  for contact info  [facility] - Office  [phone]

## 2018-08-06 NOTE — Progress Notes (Signed)
Inpatient Diabetes Program Recommendations  AACE/ADA: New Consensus Statement on Inpatient Glycemic Control (2015)  Target Ranges:  Prepandial:   less than 140 mg/dL      Peak postprandial:   less than 180 mg/dL (1-2 hours)      Critically ill patients:  140 - 180 mg/dL   Lab Results  Component Value Date   GLUCAP 259 (H) 08/06/2018   HGBA1C 8.1 (H) 08/05/2018    Review of Glycemic Control Results for Alex Salazar, Alex Salazar (MRN [id number]) as of 08/06/2018 09:50  Ref. Range 08/05/2018 11:30 08/05/2018 14:22 08/05/2018 16:35 08/05/2018 21:59 08/06/2018 07:29  Glucose-Capillary Latest Ref Range: 70 - 99 mg/dL 829 (H) 562 (H) 130 (H) 164 (H) 259 (H)   Diabetes history: DM2 Outpatient Diabetes medications: Lantus 25-40 units daily (if CBG >180, takes 40) + Glucotrol 10 mg bid + Glucophage 1 gm bid Current orders for Inpatient glycemic control: Novolog sensitive tid  Inpatient Diabetes Program Recommendations:   -Lantus 15 units daily  Thank you, Darel Hong E. Keshan Reha, RN, MSN, CDE  Diabetes Coordinator Inpatient Glycemic Control Team Team Pager [phone] (8am-5pm) 08/06/2018 9:52 AM

## 2018-09-14 ENCOUNTER — Telehealth (HOSPITAL_COMMUNITY): Payer: Self-pay | Admitting: Family Medicine

## 2018-09-14 NOTE — Telephone Encounter (Signed)
09/14/18  due to scheduling issues and was rescheduled for 7/15

## 2018-09-16 ENCOUNTER — Encounter (HOSPITAL_COMMUNITY): Payer: Self-pay

## 2018-09-16 ENCOUNTER — Ambulatory Visit (HOSPITAL_COMMUNITY): Payer: Non-veteran care

## 2018-09-23 ENCOUNTER — Ambulatory Visit (HOSPITAL_COMMUNITY): Payer: No Typology Code available for payment source | Attending: Nurse Practitioner

## 2018-09-23 ENCOUNTER — Encounter (HOSPITAL_COMMUNITY): Payer: Self-pay

## 2018-09-23 ENCOUNTER — Other Ambulatory Visit: Payer: Self-pay

## 2018-09-23 DIAGNOSIS — R29818 Other symptoms and signs involving the nervous system: Secondary | ICD-10-CM | POA: Insufficient documentation

## 2018-09-23 DIAGNOSIS — R262 Difficulty in walking, not elsewhere classified: Secondary | ICD-10-CM | POA: Diagnosis present

## 2018-09-23 DIAGNOSIS — R29898 Other symptoms and signs involving the musculoskeletal system: Secondary | ICD-10-CM | POA: Diagnosis present

## 2018-09-23 DIAGNOSIS — M6281 Muscle weakness (generalized): Secondary | ICD-10-CM | POA: Diagnosis present

## 2018-09-23 NOTE — Therapy (Signed)
[facility] [address] [place], [place], [address] Phone: [phone]   Fax:  [phone]  Physical Therapy Evaluation  Patient Details  Name: Alex Salazar MRN: [id number] Date of Birth: 1945/03/26 Referring Provider (PT): Garey HamPeggy Cassada, MD   Encounter Date: 09/23/2018  PT End of Session - 09/23/18 1531    Visit Number  1    Number of Visits  12    Date for PT Re-Evaluation  11/04/18   mini reassess 10/14/18   Authorization Type  VA (Secondary: UHC Medicare)    Authorization Time Period  09/23/18 to 11/04/18    Authorization - Visit Number  1    Authorization - Number of Visits  15    PT Start Time  1412    PT Stop Time  1501    PT Time Calculation (min)  49 min    Activity Tolerance  Patient tolerated treatment well    Behavior During Therapy  [facility] for tasks assessed/performed       Past Medical History:  Diagnosis Date  . Diabetes mellitus without complication (HCC)   . Hyperlipidemia   . Hypertension   . PE (pulmonary thromboembolism) (HCC)     Past Surgical History:  Procedure Laterality Date  . BYPASS GRAFT      There were no vitals filed for this visit.   Subjective Assessment - 09/23/18 1416    Subjective  Pt states that he was told he had a mild stroke on the L side. His L arm feels partially numb and his L leg feels a little number than his R one. His L hand is numb but isn't sure if this is from the stroke or not because this started 4months before the stroke; [facility] carpal tunnel. He states that he is liable to just drop a pencil or whatever randomly. He states that his legs are weak and it is limiting his walking; last week he was doing great but the last 2 days have been tough. He states that about 4 months before the stroke to even 1 year before hte stroke, he has just been feeling weak all over. 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 He has been on this  rollercoaster of good days and bad days for almost a year. Typically gets around the house without AD. Two falls in the last year, 1 in April when he was standing and his L leg just gave way on him. Has been using RW outside all the time since he fell in April due to fear of leg buckling again.    Limitations  Walking;House hold activities    How long can you sit comfortably?  no issues    How long can you stand comfortably?  10-15 mins average    How long can you walk comfortably?  varies, averages 5-10 mins    Patient Stated Goals  get more energy, get legs working better, maybe hand    Currently in Pain?  Yes    Pain Score  6     Pain Location  Leg    Pain Orientation  Right;Left    Pain Descriptors / Indicators  Aching    Pain Type  Chronic pain    Pain Onset  More than a month ago    Pain Frequency  Constant    Aggravating Factors   unsure    Pain Relieving Factors  unsure    Effect  of Pain on Daily Activities  increase         [facility] PT Assessment - 09/23/18 0001      Assessment   Medical Diagnosis  Weakness    Referring Provider (PT)  Garey Ham, MD    Onset Date/Surgical Date  08/05/18   stroke; gradual waxing/waning weakness 4-12 months   Hand Dominance  Right    Next MD Visit  unsure    Prior Therapy  yes for hip replacement, none for present issue      Balance Screen   Has the patient fallen in the past 6 months  Yes    How many times?  1    Has the patient had a decrease in activity level because of a fear of falling?   Yes    Is the patient reluctant to leave their home because of a fear of falling?   No      Prior Function   Level of Independence  Independent    Vocation  Retired    Leisure  watch TV, play on the computer      Cognition   Overall Cognitive Status  Within Functional Limits for tasks assessed      Observation/Other Assessments   Focus on Therapeutic Outcomes (FOTO)   to be completed next visit      Coordination   Finger Nose Finger Test   RUE WNL, LUE missed mark each time (hit just above tip of nose and then corrected)    Heel Shin Test  WNL BLE, LLE limited just due to slightly limited strength      ROM / Strength   AROM / PROM / Strength  Strength      Strength   Overall Strength Comments  tended to posterior lean during seated MMT indicating weak core     Strength Assessment Site  Hip;Knee;Ankle;Shoulder;Elbow;Wrist;Hand    Right Shoulder Flexion  5/5    Right Shoulder ABduction  4+/5    Left Shoulder Flexion  4-/5    Left Shoulder ABduction  4-/5    Right Elbow Flexion  5/5    Right Elbow Extension  5/5    Left Elbow Flexion  5/5    Left Elbow Extension  5/5    Right Wrist Flexion  5/5    Right Wrist Extension  5/5    Left Wrist Flexion  4+/5    Left Wrist Extension  4+/5    Right Hand Grip (lbs)  65    Right Hand 3 Point Pinch  13 lbs    Left Hand Grip (lbs)  55    Left Hand 3 Point Pinch  9 lbs    Right Hip Flexion  4/5    Right Hip Extension  3+/5   functional assessement   Right Hip ABduction  4-/5    Left Hip Flexion  4/5    Left Hip Extension  3+/5   functional assessment   Left Hip ABduction  4-/5    Right Knee Extension  5/5    Left Knee Extension  4+/5    Right Ankle Dorsiflexion  4-/5    Left Ankle Dorsiflexion  4-/5      Ambulation/Gait   Ambulation Distance (Feet)  434 Feet     Assistive device  Rolling walker    Gait Pattern  Step-through pattern;Decreased hip/knee flexion - left;Decreased hip/knee flexion - right;Decreased dorsiflexion - left;Decreased dorsiflexion - right;Trunk flexed   intermittent staggering/not walking straight line  Balance   Balance Assessed  Yes      Static Standing Balance   Static Standing - Balance Support  No upper extremity supported    Static Standing Balance -  Activities   Single Leg Stance - Right Leg;Single Leg Stance - Left Leg    Static Standing - Comment/# of Minutes  R: 3sec or < L: <1sec      Standardized Balance Assessment    Standardized Balance Assessment  Five Times Sit to Stand    Five times sit to stand comments   13.1sec, BUE            Objective measurements completed on examination: See above findings.          PT Education - 09/23/18 1531    Education Details  exam findings, HEP, POC    Person(s) Educated  Patient    Methods  Explanation;Demonstration;Handout    Comprehension  Verbalized understanding       PT Short Term Goals - 09/23/18 1552      PT SHORT TERM GOAL #1   Title  Pt will have improved BUE and BLE MMT by 1/2 grade throughout in order to demo improved overall strength and promote return to PLOF.    Time  3    Period  Weeks    Status  New    Target Date  10/14/18      PT SHORT TERM GOAL #2   Title  Pt will be able to perform 5xSTS in 10sec or < with 0 UE support in order to demo improved functional hip strength, balance, and reduce his risk for falls.    Time  3    Period  Weeks    Status  New      PT SHORT TERM GOAL #3   Title  Pt will have improved bil grip strength by 8# or > to demo improved overall function and maximize his functional use of BUE.    Time  3    Period  Weeks    Status  New        PT Long Term Goals - 09/23/18 1553      PT LONG TERM GOAL #1   Title  Pt will have improved BUE and BLE MMT by 1 grade throughout in order to further demo improved overall function and promote return to PLOF.    Time  6    Period  Weeks    Status  New    Target Date  11/04/18      PT LONG TERM GOAL #2   Title  Pt will be able to ambulate 610500ft during 3MWT with LRAD in order to demo improved BLE tolerance, endurance, and maximize return to PLOF.    Time  6    Period  Weeks    Status  New      PT LONG TERM GOAL #3   Title  Pt will be able to perform bil SLS for 6 sec or > without UE support to demo improved functional hip and ankle strength and stability in order to maximize gait and overall balance.    Time  6    Period  Weeks    Status  New      PT  LONG TERM GOAL #4   Title  Pt will report being able to stand for 25 mins to allow him to prepare a small meal and clean dishes with greater ease and demo improved BLE/BUE strength.  Time  6    Period  Weeks    Status  New      PT LONG TERM GOAL #5   Title  higher level/progressed goals can be established based on pt's progress             Plan - 09/23/18 1541    Clinical Impression Statement  Pt is pleasant 73YO M who presents to [facility] with c/o weakness in BLE/UE, L>R throughout, which has been waxing and waning for 1 year. Pt does have recent history of "small acute infarct of the right basal ganglia without hemorrhage or mass effect. This location affects the right internal capsule" on 08/05/18; he was hospitalized for ~2days (per chart review) and was not recommended any f/u therapies. Pt states that his weakness has worsened since the stroke but again, he has good days and bad days. Currently he has deficits in LUE strength, BLE strength, functional strength, balance, gait, and overall functional mobility due to weakness. Coordination testing WNL for BLE but limited on LUE with finger to nose testing. Pt required 13sec and BUE to complete 5xSTS testing and he was only able to ambulate 469ft with RW during 3MWT. Pt needs skilled PT intervention to address his impairments in BUE and BLE strength in order to maximize QOL, return to PLOF, and reduce risk for falls.    Personal Factors and Comorbidities  Comorbidity 3+    Comorbidities  see above; also reports bil neuropathy    Examination-Activity Limitations  Locomotion Level;Stand    Examination-Participation Restrictions  Community Activity;Yard Work;Cleaning    Stability/Clinical Decision Making  Stable/Uncomplicated    Clinical Decision Making  Low    Rehab Potential  Fair    PT Frequency  2x / week    PT Duration  6 weeks    PT Treatment/Interventions  ADLs/Self Care Home Management;Aquatic Therapy;Cryotherapy;Electrical  Stimulation;Moist Heat;Ultrasound;DME Instruction;Gait training;Stair training;Functional mobility training;Therapeutic activities;Therapeutic exercise;Balance training;Neuromuscular re-education;Patient/family education;Orthotic Fit/Training;Manual techniques;Scar mobilization;Passive range of motion;Dry needling;Energy conservation;Taping    PT Next Visit Plan  review goals and HEP, administer FOTO, begin BLE, BUE strengthening and endurance training, funcitonal strengthening, balance training, Nustep/UBE for endurance training    PT Home Exercise Plan  eval: bridging, SLR, seated GH flex and abd with RTB    Recommended Other Services  pt's referral written for weakness -- since it is from the [place], cleared with rehab manager for PT to treat both BLE and BUE weakness    Consulted and Agree with Plan of Care  Patient       Patient will benefit from skilled therapeutic intervention in order to improve the following deficits and impairments:  Abnormal gait, Decreased activity tolerance, Decreased balance, Decreased coordination, Decreased endurance, Decreased mobility, Decreased strength, Difficulty walking, Hypomobility, Increased fascial restricitons, Impaired sensation, Improper body mechanics, Postural dysfunction, Pain  Visit Diagnosis: 1. Muscle weakness (generalized)   2. Difficulty in walking, not elsewhere classified   3. Other symptoms and signs involving the musculoskeletal system   4. Other symptoms and signs involving the nervous system        Problem List Patient Active Problem List   Diagnosis Date Noted  . Acute CVA (cerebrovascular accident) ([place]) 08/05/2018  . Left leg weakness 08/05/2018  . Hyperlipidemia 08/05/2018  . Diabetes mellitus without complication ([place]) 44/31/5400  . Hypertension 08/05/2018  . Tobacco abuse 08/05/2018  . Difficulty walking 02/04/2012  . Stiffness of joints, not elsewhere classified, multiple sites 02/04/2012      Geraldine Solar PT, DPT  [facility] [address] [place], [place], [address] Phone: [phone]   Fax:  [phone]  Name: Alex Salazar MRN: [id number] Date of Birth: 1945-11-08

## 2018-09-23 NOTE — Patient Instructions (Signed)
Access Code: [email or web address]  URL: [email or web address]  Date: 09/23/2018  Prepared by: Geraldine Solar   Exercises Supine Bridge - 10 reps - 3 sets - 1x daily - 7x weekly Supine Active Straight Leg Raise - 10 reps - 3 sets - 1x daily - 7x weekly Seated Shoulder Front Raise with Resistance - 10 reps - 3 sets - 1x daily - 7x weekly Seated Shoulder Abduction with Resistance - 10 reps - 3 sets - 1x daily - 7x weekly

## 2018-09-30 ENCOUNTER — Encounter (HOSPITAL_COMMUNITY): Payer: Self-pay

## 2018-09-30 ENCOUNTER — Other Ambulatory Visit: Payer: Self-pay

## 2018-09-30 ENCOUNTER — Ambulatory Visit (HOSPITAL_COMMUNITY): Payer: No Typology Code available for payment source

## 2018-09-30 DIAGNOSIS — M6281 Muscle weakness (generalized): Secondary | ICD-10-CM | POA: Diagnosis not present

## 2018-09-30 DIAGNOSIS — R29818 Other symptoms and signs involving the nervous system: Secondary | ICD-10-CM

## 2018-09-30 DIAGNOSIS — R29898 Other symptoms and signs involving the musculoskeletal system: Secondary | ICD-10-CM

## 2018-09-30 DIAGNOSIS — R262 Difficulty in walking, not elsewhere classified: Secondary | ICD-10-CM

## 2018-09-30 NOTE — Therapy (Signed)
[facility] [address] [place], [place], [address] Phone: [phone]   Fax:  [phone]  Physical Therapy Treatment  Patient Details  Name: Alex Salazar MRN: [id number] Date of Birth: [birth year] Referring Provider (PT): Garey HamPeggy Cassada, MD   Encounter Date: 09/30/2018  PT End of Session - 09/30/18 1424    Visit Number  2    Number of Visits  12    Date for PT Re-Evaluation  11/04/18   mini reassess 10/14/18   Authorization Type  VA (Secondary: UHC Medicare)    Authorization Time Period  09/23/18 to 11/04/18    Authorization - Visit Number  2    Authorization - Number of Visits  15    PT Start Time  1419    PT Stop Time  1500    PT Time Calculation (min)  41 min    Activity Tolerance  Patient tolerated treatment well    Behavior During Therapy  [facility] for tasks assessed/performed       Past Medical History:  Diagnosis Date  . Diabetes mellitus without complication (HCC)   . Hyperlipidemia   . Hypertension   . PE (pulmonary thromboembolism) (HCC)     Past Surgical History:  Procedure Laterality Date  . BYPASS GRAFT      There were no vitals filed for this visit.  Subjective Assessment - 09/30/18 1419    Subjective  Pt reports his L hip has been hurting him since he woke up, but has gone down from a 10/10 to 5/10. Pt reports he did his UE exercises today but unable to do LE due to pain. Pt reports he is going to his PCP tomorrow for L tingling in hand and to discuss constant fatigue.Pt reports some days it takes him 30-45 minutes to get the energy to get out of bed and go.    Limitations  Walking;House hold activities    How long can you sit comfortably?  no issues    How long can you stand comfortably?  10-15 mins average    How long can you walk comfortably?  varies, averages 5-10 mins    Patient Stated Goals  get more energy, get legs working better, maybe hand    Currently in Pain?  Yes    Pain Score  5     Pain  Location  Hip    Pain Orientation  Left    Pain Descriptors / Indicators  Aching    Pain Type  Chronic pain    Pain Onset  More than a month ago    Pain Frequency  Constant    Aggravating Factors   unsure    Pain Relieving Factors  unsure    Effect of Pain on Daily Activities  increase               [facility] Adult PT Treatment/Exercise - 09/30/18 0001      Exercises   Exercises  Knee/Hip      Knee/Hip Exercises: Standing   Hip Abduction  Both;2 sets;10 reps    Abduction Limitations  RTB    Lateral Step Up  Both;2 sets;10 reps;Step Height: 4";Hand Hold: 2    Forward Step Up  Both;10 reps;Step Height: 4";Hand Hold: 2    Other Standing Knee Exercises  shoulder ext, RTB, x10 reps; rows, RTB, x10 reps    Other Standing Knee Exercises  hip hike BLE, 4" step, x10 reps each      Knee/Hip Exercises: Seated  Other Seated Knee/Hip Exercises  Seated GH flexion and abduction, RTB, x10 reps each          Balance Exercises - 09/30/18 1444      Balance Exercises: Standing   Tandem Stance  Eyes open;5 reps;30 secs   R back: up to 15 sec; L back: up to 3 sec   SLS  Eyes open;Solid surface;5 reps;30 secs   R: up to 4 sec, L: up to 2 sec       PT Education - 09/30/18 1423    Education Details  FOTO findings, reviewed goals, exercise tehcnique, updated HEP    Person(s) Educated  Patient    Methods  Explanation;Demonstration;Handout    Comprehension  Verbalized understanding;Returned demonstration       PT Short Term Goals - 09/23/18 1552      PT SHORT TERM GOAL #1   Title  Pt will have improved BUE and BLE MMT by 1/2 grade throughout in order to demo improved overall strength and promote return to PLOF.    Time  3    Period  Weeks    Status  New    Target Date  10/14/18      PT SHORT TERM GOAL #2   Title  Pt will be able to perform 5xSTS in 10sec or < with 0 UE support in order to demo improved functional hip strength, balance, and reduce his risk for falls.    Time  3     Period  Weeks    Status  New      PT SHORT TERM GOAL #3   Title  Pt will have improved bil grip strength by 8# or > to demo improved overall function and maximize his functional use of BUE.    Time  3    Period  Weeks    Status  New        PT Long Term Goals - 09/23/18 1553      PT LONG TERM GOAL #1   Title  Pt will have improved BUE and BLE MMT by 1 grade throughout in order to further demo improved overall function and promote return to PLOF.    Time  6    Period  Weeks    Status  New    Target Date  11/04/18      PT LONG TERM GOAL #2   Title  Pt will be able to ambulate 63700ft during 3MWT with LRAD in order to demo improved BLE tolerance, endurance, and maximize return to PLOF.    Time  6    Period  Weeks    Status  New      PT LONG TERM GOAL #3   Title  Pt will be able to perform bil SLS for 6 sec or > without UE support to demo improved functional hip and ankle strength and stability in order to maximize gait and overall balance.    Time  6    Period  Weeks    Status  New      PT LONG TERM GOAL #4   Title  Pt will report being able to stand for 25 mins to allow him to prepare a small meal and clean dishes with greater ease and demo improved BLE/BUE strength.    Time  6    Period  Weeks    Status  New      PT LONG TERM GOAL #5   Title  higher level/progressed goals can be  established based on pt's progress            Plan - 09/30/18 1424    Clinical Impression Statement  Began treatment session by reviewing pt's goals. Initiated strength training this date with standing exercises in parallel bars for added balance support. Pt limited and required seated rest breaks between exercises due to L hip pain and BLE getting weak complaints. Pt able to perform standing hip abd with RTB requiring verbal cues for slow controlled arc of motion and maintaining extended knees. Pt bil hip weakness L>R AEB hip drop in hip abd and SLS exercises this date. Initiated hip hikes  from 4" step to engage glute med contraction requiring repeated reps to improve form and verbal cues to use BUE for balance support and not pushing through them on parallel bars. Pt perform tandem stance and SLS, with RLE consistently performing for longer than LLE. Pt able to maintain tandem stance with RLE back for up to 15 sec increments before requiring UE assist on parallel bars and only 3 sec on LLE. Also, pt able to perform R SLS for up to 4 sec and L SLS for up to 2 sec before requiring UE assist. Added UE and postural strengthening throughout standing rows and lat pull downs with resistance bands and seated GH flexion and abd. Pt reports success with UE exercises at home, but limited with LE due to hip pain. Pt denies pain at EOS. Pt continues to complain of general fatigue, and educated pt to inform MD at next appointment of symptoms. Continue to progress as able.    Personal Factors and Comorbidities  Comorbidity 3+    Comorbidities  see above; also reports bil neuropathy    Examination-Activity Limitations  Locomotion Level;Stand    Examination-Participation Restrictions  Community Activity;Yard Work;Cleaning    Stability/Clinical Decision Making  Stable/Uncomplicated    Rehab Potential  Fair    PT Frequency  2x / week    PT Duration  6 weeks    PT Treatment/Interventions  ADLs/Self Care Home Management;Aquatic Therapy;Cryotherapy;Electrical Stimulation;Moist Heat;Ultrasound;DME Instruction;Gait training;Stair training;Functional mobility training;Therapeutic activities;Therapeutic exercise;Balance training;Neuromuscular re-education;Patient/family education;Orthotic Fit/Training;Manual techniques;Scar mobilization;Passive range of motion;Dry needling;Energy conservation;Taping    PT Next Visit Plan  Administer FOTO. Progress BLE and BUE strengthening. Initiate endurance training with NuStep/UBE. Continue funcitonal strengthening, balance training. Manual STM as needed for pain relief.    PT  Home Exercise Plan  eval: bridging, SLR, seated GH flex and abd with RTB; 7/22: hip abd RTB, STS    Consulted and Agree with Plan of Care  Patient       Patient will benefit from skilled therapeutic intervention in order to improve the following deficits and impairments:  Abnormal gait, Decreased activity tolerance, Decreased balance, Decreased coordination, Decreased endurance, Decreased mobility, Decreased strength, Difficulty walking, Hypomobility, Increased fascial restricitons, Impaired sensation, Improper body mechanics, Postural dysfunction, Pain  Visit Diagnosis: 1. Muscle weakness (generalized)   2. Difficulty in walking, not elsewhere classified   3. Other symptoms and signs involving the musculoskeletal system   4. Other symptoms and signs involving the nervous system        Problem List Patient Active Problem List   Diagnosis Date Noted  . Acute CVA (cerebrovascular accident) (HCC) 08/05/2018  . Left leg weakness 08/05/2018  . Hyperlipidemia 08/05/2018  . Diabetes mellitus without complication (HCC) 08/05/2018  . Hypertension 08/05/2018  . Tobacco abuse 08/05/2018  . Difficulty walking 02/04/2012  . Stiffness of joints, not elsewhere classified, multiple sites 02/04/2012  Talbot Grumbling PT, [place] [address] [place], [place], [address] Phone: [phone]   Fax:  [phone]  Name: Alex Salazar MRN: [id number] Date of Birth: 30-Nov-1945

## 2018-10-09 ENCOUNTER — Ambulatory Visit (HOSPITAL_COMMUNITY): Payer: No Typology Code available for payment source | Admitting: Physical Therapy

## 2018-10-09 ENCOUNTER — Other Ambulatory Visit: Payer: Self-pay

## 2018-10-09 ENCOUNTER — Encounter (HOSPITAL_COMMUNITY): Payer: Self-pay | Admitting: Physical Therapy

## 2018-10-09 DIAGNOSIS — M6281 Muscle weakness (generalized): Secondary | ICD-10-CM

## 2018-10-09 DIAGNOSIS — R262 Difficulty in walking, not elsewhere classified: Secondary | ICD-10-CM

## 2018-10-09 DIAGNOSIS — R29898 Other symptoms and signs involving the musculoskeletal system: Secondary | ICD-10-CM

## 2018-10-09 DIAGNOSIS — R29818 Other symptoms and signs involving the nervous system: Secondary | ICD-10-CM

## 2018-10-09 NOTE — Therapy (Signed)
[place], [place], [address] Phone: [phone]   Fax:  [phone]  Physical Therapy Treatment  Patient Details  Name: Alex Salazar MRN: [id number] Date of Birth: [birth year] Referring Provider (PT): Cleophas Dunker, MD   Encounter Date: 10/09/2018  PT End of Session - 10/09/18 1538    Visit Number  3    Number of Visits  12    Date for PT Re-Evaluation  11/04/18   mini reassess 10/14/18   Authorization Type  [place] (Secondary: UHC Medicare)    Authorization Time Period  09/23/18 to 11/04/18    Authorization - Visit Number  3    Authorization - Number of Visits  15    PT Start Time  5102    PT Stop Time  1610    PT Time Calculation (min)  40 min    Activity Tolerance  Patient tolerated treatment well    Behavior During Therapy  [facility] for tasks assessed/performed       Past Medical History:  Diagnosis Date  . Diabetes mellitus without complication ([place])   . Hyperlipidemia   . Hypertension   . PE (pulmonary thromboembolism) ([place])     Past Surgical History:  Procedure Laterality Date  . BYPASS GRAFT      There were no vitals filed for this visit.  Subjective Assessment - 10/09/18 1534    Subjective  Patient reported having back pain this session which he rated as a 5/10. Patient reported going to MD to examine why hand is numb and reported that they found his left pinky has no feeling in it. He said he has an appointment on 10/28/18 to see about carpal tunnel.    Limitations  Walking;House hold activities    How long can you sit comfortably?  no issues    How long can you stand comfortably?  10-15 mins average    How long can you walk comfortably?  varies, averages 5-10 mins    Patient Stated Goals  get more energy, get legs working better, maybe hand    Pain Onset  More than a month ago                       [facility] Adult PT Treatment/Exercise - 10/09/18 0001      Exercises   Exercises   Knee/Hip      Knee/Hip Exercises: Standing   Hip Abduction  Both;2 sets;10 reps    Abduction Limitations  RTB    Hip Extension  Both;Stengthening;2 sets;10 reps    Lateral Step Up  Both;2 sets;10 reps;Step Height: 4";Hand Hold: 2    Forward Step Up  Both;10 reps;Step Height: 4";Hand Hold: 2    Other Standing Knee Exercises  Forward and sidestepping inside parallel bars over (3) 6-inch hurdles    Other Standing Knee Exercises  hip hike BLE, 4" step, x10 reps each      Knee/Hip Exercises: Seated   Other Seated Knee/Hip Exercises  Seated GH flexion and abduction, RTB, x10 reps each    Sit to Sand  1 set;10 reps;without UE support   Deferred second set due to back pain            PT Education - 10/09/18 1624    Education Details  Purpose and technique of interventions throughout.    Person(s) Educated  Patient    Methods  Explanation    Comprehension  Verbalized understanding  PT Short Term Goals - 09/23/18 1552      PT SHORT TERM GOAL #1   Title  Pt will have improved BUE and BLE MMT by 1/2 grade throughout in order to demo improved overall strength and promote return to PLOF.    Time  3    Period  Weeks    Status  New    Target Date  10/14/18      PT SHORT TERM GOAL #2   Title  Pt will be able to perform 5xSTS in 10sec or < with 0 UE support in order to demo improved functional hip strength, balance, and reduce his risk for falls.    Time  3    Period  Weeks    Status  New      PT SHORT TERM GOAL #3   Title  Pt will have improved bil grip strength by 8# or > to demo improved overall function and maximize his functional use of BUE.    Time  3    Period  Weeks    Status  New        PT Long Term Goals - 09/23/18 1553      PT LONG TERM GOAL #1   Title  Pt will have improved BUE and BLE MMT by 1 grade throughout in order to further demo improved overall function and promote return to PLOF.    Time  6    Period  Weeks    Status  New    Target Date  11/04/18       PT LONG TERM GOAL #2   Title  Pt will be able to ambulate 67800ft during 3MWT with LRAD in order to demo improved BLE tolerance, endurance, and maximize return to PLOF.    Time  6    Period  Weeks    Status  New      PT LONG TERM GOAL #3   Title  Pt will be able to perform bil SLS for 6 sec or > without UE support to demo improved functional hip and ankle strength and stability in order to maximize gait and overall balance.    Time  6    Period  Weeks    Status  New      PT LONG TERM GOAL #4   Title  Pt will report being able to stand for 25 mins to allow him to prepare a small meal and clean dishes with greater ease and demo improved BLE/BUE strength.    Time  6    Period  Weeks    Status  New      PT LONG TERM GOAL #5   Title  higher level/progressed goals can be established based on pt's progress            Plan - 10/09/18 1629    Clinical Impression Statement  Continued this session with established plan of care. This session added ambulation over hurdles inside parallel bars. With sidestepping over hurdles patient required frequent cueing to maintain toes pointing forward. Also, added sit to stands this session with patient reporting an increase in back pain, so did not perform a second set. Patient would benefit from continued skilled physical therapy in order to continue progressing towards functional goals.    Personal Factors and Comorbidities  Comorbidity 3+    Comorbidities  see above; also reports bil neuropathy    Examination-Activity Limitations  Locomotion Level;Stand    Examination-Participation Restrictions  Community Activity;[employer]  Work;Cleaning    Stability/Clinical Decision Making  Stable/Uncomplicated    Rehab Potential  Fair    PT Frequency  2x / week    PT Duration  6 weeks    PT Treatment/Interventions  ADLs/Self Care Home Management;Aquatic Therapy;Cryotherapy;Electrical Stimulation;Moist Heat;Ultrasound;DME Instruction;Gait training;Stair  training;Functional mobility training;Therapeutic activities;Therapeutic exercise;Balance training;Neuromuscular re-education;Patient/family education;Orthotic Fit/Training;Manual techniques;Scar mobilization;Passive range of motion;Dry needling;Energy conservation;Taping    PT Next Visit Plan  Administer FOTO. Progress BLE and BUE strengthening. Initiate endurance training with NuStep/UBE. Continue funcitonal strengthening, balance training. Manual STM as needed for pain relief.    PT Home Exercise Plan  eval: bridging, SLR, seated GH flex and abd with RTB; 7/22: hip abd RTB, STS    Consulted and Agree with Plan of Care  Patient       Patient will benefit from skilled therapeutic intervention in order to improve the following deficits and impairments:  Abnormal gait, Decreased activity tolerance, Decreased balance, Decreased coordination, Decreased endurance, Decreased mobility, Decreased strength, Difficulty walking, Hypomobility, Increased fascial restricitons, Impaired sensation, Improper body mechanics, Postural dysfunction, Pain  Visit Diagnosis: 1. Muscle weakness (generalized)   2. Difficulty in walking, not elsewhere classified   3. Other symptoms and signs involving the musculoskeletal system   4. Other symptoms and signs involving the nervous system        Problem List Patient Active Problem List   Diagnosis Date Noted  . Acute CVA (cerebrovascular accident) ([facility]) 08/05/2018  . Left leg weakness 08/05/2018  . Hyperlipidemia 08/05/2018  . Diabetes mellitus without complication ([facility]) 08/05/2018  . Hypertension 08/05/2018  . Tobacco abuse 08/05/2018  . Difficulty walking 02/04/2012  . Stiffness of joints, not elsewhere classified, multiple sites 02/04/2012   Verne CarrowMacy Boluwatife Flight PT, DPT 4:30 PM, 10/09/18 [phone]  [facility] [address] [place], [place], [address] Phone: [phone]   Fax:  [phone]  Name: Alex Salazar MRN: [id number] Date of Birth: 03-28-1945

## 2018-10-14 ENCOUNTER — Ambulatory Visit (HOSPITAL_COMMUNITY): Payer: Medicare Other | Attending: Nurse Practitioner

## 2018-10-14 ENCOUNTER — Other Ambulatory Visit: Payer: Self-pay

## 2018-10-14 ENCOUNTER — Encounter (HOSPITAL_COMMUNITY): Payer: Self-pay

## 2018-10-14 DIAGNOSIS — R262 Difficulty in walking, not elsewhere classified: Secondary | ICD-10-CM | POA: Diagnosis present

## 2018-10-14 DIAGNOSIS — M6281 Muscle weakness (generalized): Secondary | ICD-10-CM | POA: Insufficient documentation

## 2018-10-14 DIAGNOSIS — R29898 Other symptoms and signs involving the musculoskeletal system: Secondary | ICD-10-CM | POA: Diagnosis present

## 2018-10-14 DIAGNOSIS — R29818 Other symptoms and signs involving the nervous system: Secondary | ICD-10-CM | POA: Diagnosis present

## 2018-10-14 NOTE — Therapy (Signed)
[facility] [address] [place], [place], [address] Phone: [phone]   Fax:  [phone]  Physical Therapy Treatment  Patient Details  Name: Alex Salazar MRN: [id number] Date of Birth: [birth year] Referring Provider (PT): Garey HamPeggy Cassada, MD   Encounter Date: 10/14/2018  PT End of Session - 10/14/18 1421    Visit Number  4    Number of Visits  12    Date for PT Re-Evaluation  11/04/18   mini reassess 10/14/18   Authorization Type  [place] (Secondary: UHC Medicare)    Authorization Time Period  09/23/18 to 11/04/18    Authorization - Visit Number  4    Authorization - Number of Visits  15    PT Start Time  1415    PT Stop Time  1500    PT Time Calculation (min)  45 min    Activity Tolerance  Patient tolerated treatment well    Behavior During Therapy  [facility] for tasks assessed/performed       Past Medical History:  Diagnosis Date  . Diabetes mellitus without complication ([facility])   . Hyperlipidemia   . Hypertension   . PE (pulmonary thromboembolism) ([facility])     Past Surgical History:  Procedure Laterality Date  . BYPASS GRAFT      There were no vitals filed for this visit.  Subjective Assessment - 10/14/18 1416    Subjective  Pt reports no back pain today. Pt reports he slept in the recliner last night. Pt reports he has been able to walk around more in the home without support. Pt reports he is going to doctor to check his hand nerves 8/19.    Limitations  Walking;House hold activities    How long can you sit comfortably?  no issues    How long can you stand comfortably?  10-15 mins average    How long can you walk comfortably?  varies, averages 5-10 mins    Patient Stated Goals  get more energy, get legs working better, maybe hand    Currently in Pain?  No/denies    Pain Onset  More than a month ago         [facility] - 10/14/18 0001      Knee/Hip Exercises: Stretches   Passive Hamstring Stretch   Both;30 seconds    Passive Hamstring Stretch Limitations  sheet to assist    Other Knee/Hip Stretches  SKTC 2x30 sec; figure 4, 2x30 secs      Knee/Hip Exercises: Aerobic   Nustep  Level 1, x4 minutes   >80 SPM     Knee/Hip Exercises: Standing   Other Standing Knee Exercises  shoulder ext, RTB, 2x10 reps; rows, RTB, 2x10 reps    Other Standing Knee Exercises  hip hike, 4" step, 2x10 reps each      Knee/Hip Exercises: Seated   Other Seated Knee/Hip Exercises  Seated GH flexion and abduction, RTB, 2x10 reps each    Sit to Sand  2 sets;10 reps   only able to complete 8 reps for set 2            PT Education - 10/14/18 1420    Education Details  Exercise technique, updated HEP    Person(s) Educated  Patient    Methods  Explanation;Demonstration;Handout    Comprehension  Verbalized understanding;Returned demonstration       PT Short Term Goals - 09/23/18 1552      PT SHORT TERM GOAL #1  Title  Pt will have improved BUE and BLE MMT by 1/2 grade throughout in order to demo improved overall strength and promote return to PLOF.    Time  3    Period  Weeks    Status  New    Target Date  10/14/18      PT SHORT TERM GOAL #2   Title  Pt will be able to perform 5xSTS in 10sec or < with 0 UE support in order to demo improved functional hip strength, balance, and reduce his risk for falls.    Time  3    Period  Weeks    Status  New      PT SHORT TERM GOAL #3   Title  Pt will have improved bil grip strength by 8# or > to demo improved overall function and maximize his functional use of BUE.    Time  3    Period  Weeks    Status  New        PT Long Term Goals - 09/23/18 1553      PT LONG TERM GOAL #1   Title  Pt will have improved BUE and BLE MMT by 1 grade throughout in order to further demo improved overall function and promote return to PLOF.    Time  6    Period  Weeks    Status  New    Target Date  11/04/18      PT LONG TERM GOAL #2   Title  Pt will be able to  ambulate 650ft during 3MWT with LRAD in order to demo improved BLE tolerance, endurance, and maximize return to PLOF.    Time  6    Period  Weeks    Status  New      PT LONG TERM GOAL #3   Title  Pt will be able to perform bil SLS for 6 sec or > without UE support to demo improved functional hip and ankle strength and stability in order to maximize gait and overall balance.    Time  6    Period  Weeks    Status  New      PT LONG TERM GOAL #4   Title  Pt will report being able to stand for 25 mins to allow him to prepare a small meal and clean dishes with greater ease and demo improved BLE/BUE strength.    Time  6    Period  Weeks    Status  New      PT LONG TERM GOAL #5   Title  higher level/progressed goals can be established based on pt's progress            Plan - 10/14/18 1507    Clinical Impression Statement  Continued with pt's established POC. Pt attempted increasing reps for STS, but only able to perform 8 reps in second set due to BLE weakness. Pt required BUE and core strengthening exercises to be performed in sitting due to BLE fatigue. Pt with increased low back pain and muscle spasm with sit to supine transfer requiring stretching exercises in supine to alleviate pain. Pt with complete resolution of LBP after supine stretching and able to finish on NuStep for cardiovascular endurance training using BUE and BLE to maintain >80 steps per minute for endurance training. Pt denies pain at EOS. Continue to progress as able.    Personal Factors and Comorbidities  Comorbidity 3+    Comorbidities  see above; also reports  bil neuropathy    Examination-Activity Limitations  Locomotion Level;Stand    Examination-Participation Restrictions  Community Activity;Yard Work;Cleaning    Stability/Clinical Decision Making  Stable/Uncomplicated    Rehab Potential  Fair    PT Frequency  2x / week    PT Duration  6 weeks    PT Treatment/Interventions  ADLs/Self Care Home Management;Aquatic  Therapy;Cryotherapy;Electrical Stimulation;Moist Heat;Ultrasound;DME Instruction;Gait training;Stair training;Functional mobility training;Therapeutic activities;Therapeutic exercise;Balance training;Neuromuscular re-education;Patient/family education;Orthotic Fit/Training;Manual techniques;Scar mobilization;Passive range of motion;Dry needling;Energy conservation;Taping    PT Next Visit Plan  Progress BLE and BUE strengthening. Progress enruance training with NuStep/UBE. Continue funcitonal strengthening, balance training. Manual STM as needed for pain relief.    PT Home Exercise Plan  eval: bridging, SLR, seated GH flex and abd with RTB; 7/22: hip abd RTB, STS; 8/5: shoulder ext and rows with RTB and anchor over door, SKTC    Consulted and Agree with Plan of Care  Patient       Patient will benefit from skilled therapeutic intervention in order to improve the following deficits and impairments:  Abnormal gait, Decreased activity tolerance, Decreased balance, Decreased coordination, Decreased endurance, Decreased mobility, Decreased strength, Difficulty walking, Hypomobility, Increased fascial restricitons, Impaired sensation, Improper body mechanics, Postural dysfunction, Pain  Visit Diagnosis: 1. Muscle weakness (generalized)   2. Difficulty in walking, not elsewhere classified   3. Other symptoms and signs involving the musculoskeletal system   4. Other symptoms and signs involving the nervous system        Problem List Patient Active Problem List   Diagnosis Date Noted  . Acute CVA (cerebrovascular accident) ([facility]) 08/05/2018  . Left leg weakness 08/05/2018  . Hyperlipidemia 08/05/2018  . Diabetes mellitus without complication ([facility]) 08/05/2018  . Hypertension 08/05/2018  . Tobacco abuse 08/05/2018  . Difficulty walking 02/04/2012  . Stiffness of joints, not elsewhere classified, multiple sites 02/04/2012      Domenick Bookbinderori Melda Mermelstein PT, DPT 10/14/18, 3:14 PM [phone]  [facility] [address] [place], [place], [address] Phone: [phone]   Fax:  [phone]  Name: Alex Salazar MRN: [id number] Date of Birth: 06/22/1945

## 2018-10-16 ENCOUNTER — Ambulatory Visit (HOSPITAL_COMMUNITY): Payer: Medicare Other

## 2018-10-16 ENCOUNTER — Other Ambulatory Visit: Payer: Self-pay

## 2018-10-16 ENCOUNTER — Encounter (HOSPITAL_COMMUNITY): Payer: Self-pay

## 2018-10-16 DIAGNOSIS — M6281 Muscle weakness (generalized): Secondary | ICD-10-CM

## 2018-10-16 DIAGNOSIS — R262 Difficulty in walking, not elsewhere classified: Secondary | ICD-10-CM

## 2018-10-16 DIAGNOSIS — R29898 Other symptoms and signs involving the musculoskeletal system: Secondary | ICD-10-CM

## 2018-10-16 DIAGNOSIS — R29818 Other symptoms and signs involving the nervous system: Secondary | ICD-10-CM

## 2018-10-16 NOTE — Therapy (Signed)
[facility] [address] [place], [place], [address] Phone: [phone]   Fax:  [phone]   Progress Note Reporting Period 09/23/18 to 10/16/18  See note below for Objective Data and Assessment of Progress/Goals.       Physical Therapy Treatment  Patient Details  Name: Alex Salazar MRN: [id number] Date of Birth: [birth year] Referring Provider (PT): Garey HamPeggy Cassada, MD   Encounter Date: 10/16/2018  PT End of Session - 10/16/18 1403    Visit Number  5    Number of Visits  12    Date for PT Re-Evaluation  11/04/18    Authorization Type  VA (Secondary: UHC Medicare)    Authorization Time Period  09/23/18 to 11/04/18    Authorization - Visit Number  5   reassessment completed 8/7, visit #5   Authorization - Number of Visits  15    PT Start Time  1405    PT Stop Time  1450    PT Time Calculation (min)  45 min    Activity Tolerance  Patient tolerated treatment well    Behavior During Therapy  [facility] for tasks assessed/performed       Past Medical History:  Diagnosis Date  . Diabetes mellitus without complication (HCC)   . Hyperlipidemia   . Hypertension   . PE (pulmonary thromboembolism) (HCC)     Past Surgical History:  Procedure Laterality Date  . BYPASS GRAFT      There were no vitals filed for this visit.  Subjective Assessment - 10/16/18 1403    Subjective  Pt reports his BLE feel weird. Pt reports it feels like he is standing on sponges and his feet don't feel like they're on the floor.    Limitations  Walking;House hold activities    How long can you sit comfortably?  no issues    How long can you stand comfortably?  10-15 mins average    How long can you walk comfortably?  varies, averages 5-10 mins    Patient Stated Goals  get more energy, get legs working better, maybe hand    Currently in Pain?  No/denies    Pain Onset  More than a month ago         [facility] - 10/16/18 0001      Assessment   Medical Diagnosis  Weakness    Referring Provider (PT)  Garey HamPeggy Cassada, MD    Onset Date/Surgical Date  08/05/18   stroke; gradual waxing/waning weakness 4-12 months   Hand Dominance  Right    Prior Therapy  yes for hip replacement, none for present issue      Balance Screen   Has the patient fallen in the past 6 months  No   none since starting therapy   Has the patient had a decrease in activity level because of a fear of falling?   Yes    Is the patient reluctant to leave their home because of a fear of falling?   No      Coordination   Finger Nose Finger Test  WNL bil, no missed marks   was RUE WNL, LUE missed mark each time    Heel Shin Test  WNL bil   was WNL BLE, LLE limited due to slightly limited strengh     Strength   Overall Strength Comments  tended to posterior lean during seated MMT indicating weak core     Right Shoulder Flexion  5/5   was  5   Right Shoulder ABduction  5/5   was 4+   Left Shoulder Flexion  5/5   was 4-   Left Shoulder ABduction  4/5   was 4-   Right Elbow Flexion  5/5   was 5   Right Elbow Extension  5/5   was 5   Left Elbow Flexion  5/5   was 5   Left Elbow Extension  4+/5   was 5   Right Wrist Flexion  5/5   was 5   Right Wrist Extension  5/5   was 5   Left Wrist Flexion  5/5   was 4+   Left Wrist Extension  5/5   was 4+   Right Hand Grip (lbs)  65   was 65   Right Hand 3 Point Pinch  19 lbs   was 13   Left Hand Grip (lbs)  53   was 55   Left Hand 3 Point Pinch  8 lbs   was 9   Right Hip Flexion  4/5   was 4   Right Hip ABduction  4-/5   was 4-   Left Hip Flexion  4/5   was 4   Left Hip ABduction  3+/5   was 4-   Right Knee Extension  5/5   was 5   Left Knee Extension  4+/5   was 4+   Right Ankle Dorsiflexion  4/5   was 4-   Left Ankle Dorsiflexion  4/5   was 4-     Ambulation/Gait   Ambulation Distance (Feet)  512 Feet   was 434 ft   Assistive device  Rolling walker    Gait Pattern  Step-through  pattern;Decreased hip/knee flexion - left;Decreased hip/knee flexion - right;Decreased dorsiflexion - left;Decreased dorsiflexion - right;Trunk flexed    Gait Comments  3MWT      Balance   Balance Assessed  Yes      Static Standing Balance   Static Standing - Balance Support  No upper extremity supported    Static Standing Balance -  Activities   Single Leg Stance - Right Leg;Single Leg Stance - Left Leg    Static Standing - Comment/# of Minutes  R:  2 sec or <, L: <1 sec   was R: 3sec or < L: <1sec     Standardized Balance Assessment   Standardized Balance Assessment  Five Times Sit to Stand    Five times sit to stand comments   18 sec, no BUE   was 13.1 sec      [facility] Adult PT Treatment/Exercise - 10/16/18 0001      Knee/Hip Exercises: Standing   Lateral Step Up  Both;2 sets;10 reps;Step Height: 4";Hand Hold: 2    Forward Step Up  Both;10 reps;Step Height: 4";Hand Hold: 2    Other Standing Knee Exercises  sidestepping in // bars, RTB, x3RT          Balance Exercises - 10/16/18 1457      Balance Exercises: Standing   Tandem Stance  Eyes open;5 reps;30 secs   R back: up to 30 sec, L back: up to 5 sec       PT Education - 10/16/18 1403    Education Details  Reassessment findings, exercise technique, continue HEP    Person(s) Educated  Patient    Methods  Explanation    Comprehension  Verbalized understanding       PT Short Term Goals - 10/16/18 1404  PT SHORT TERM GOAL #1   Title  Pt will have improved BUE and BLE MMT by 1/2 grade throughout in order to demo improved overall strength and promote return to PLOF.    Baseline  8/7: see MMT    Time  3    Period  Weeks    Status  On-going    Target Date  10/14/18      PT SHORT TERM GOAL #2   Title  Pt will be able to perform 5xSTS in 10sec or < with 0 UE support in order to demo improved functional hip strength, balance, and reduce his risk for falls.    Baseline  8/7: 18 sec    Time  3    Period  Weeks     Status  On-going      PT SHORT TERM GOAL #3   Title  Pt will have improved bil grip strength by 8# or > to demo improved overall function and maximize his functional use of BUE.    Baseline  8/7: R 65# (no change), L 53# (-2# from eval)    Time  3    Period  Weeks    Status  On-going        PT Long Term Goals - 10/16/18 1427      PT LONG TERM GOAL #1   Title  Pt will have improved BUE and BLE MMT by 1 grade throughout in order to further demo improved overall function and promote return to PLOF.    Baseline  8/7: see MMT    Time  6    Period  Weeks    Status  On-going      PT LONG TERM GOAL #2   Title  Pt will be able to ambulate 639ft during 3MWT with LRAD in order to demo improved BLE tolerance, endurance, and maximize return to PLOF.    Baseline  8/7: 512 ft with RW    Time  6    Period  Weeks    Status  On-going      PT LONG TERM GOAL #3   Title  Pt will be able to perform bil SLS for 6 sec or > without UE support to demo improved functional hip and ankle strength and stability in order to maximize gait and overall balance.    Baseline  8/7: R: 2 sec or <, L:<1 sec    Time  6    Period  Weeks    Status  On-going      PT LONG TERM GOAL #4   Title  Pt will report being able to stand for 25 mins to allow him to prepare a small meal and clean dishes with greater ease and demo improved BLE/BUE strength.    Baseline  8/7: 15 minutes before needing to sit and rest    Time  6    Period  Weeks    Status  On-going            Plan - 10/16/18 1404    Clinical Impression Statement  Pt due for mini reassessment of goals and objective measures this date. Objectively, pt has made some improvements in strength or has maintained strength since initial evaluation. Pt continues to demonstrate deficits in balance per SLS time has not significantly changed. Also, functionally pt continues to require increased time with STS transfers. Pt with increased endurance per ability to ambulate  512 ft in 3MWT compared to 434 ft. Ended treatment session  with therapeutic exercises and balance challenging exercises. Pt with some improvements in tandem stance with RLE back demonstrating ability to hold for 30 sec increments, but only able to hold for 5-3 sec holds with LLE back. Pt would continue to benefit from skilled PT interventions to improve strength, balance, endurance, reduce pain and improve ability to perform functional activities without fear of falling.    Personal Factors and Comorbidities  Comorbidity 3+    Comorbidities  see above; also reports bil neuropathy    Examination-Activity Limitations  Locomotion Level;Stand    Examination-Participation Restrictions  Community Activity;Yard Work;Cleaning    Stability/Clinical Decision Making  Stable/Uncomplicated    Rehab Potential  Fair    PT Frequency  2x / week    PT Duration  6 weeks    PT Treatment/Interventions  ADLs/Self Care Home Management;Aquatic Therapy;Cryotherapy;Electrical Stimulation;Moist Heat;Ultrasound;DME Instruction;Gait training;Stair training;Functional mobility training;Therapeutic activities;Therapeutic exercise;Balance training;Neuromuscular re-education;Patient/family education;Orthotic Fit/Training;Manual techniques;Scar mobilization;Passive range of motion;Dry needling;Energy conservation;Taping    PT Next Visit Plan  Progress BLE and BUE strengthening with exercises and functional tasks. Progress balance training. Progress enruance training with NuStep/UBE.  Manual STM as needed for pain relief.    PT Home Exercise Plan  eval: bridging, SLR, seated GH flex and abd with RTB; 7/22: hip abd RTB, STS; 8/5: shoulder ext and rows with RTB and anchor over door, SKTC    Consulted and Agree with Plan of Care  Patient       Patient will benefit from skilled therapeutic intervention in order to improve the following deficits and impairments:  Abnormal gait, Decreased activity tolerance, Decreased balance, Decreased  coordination, Decreased endurance, Decreased mobility, Decreased strength, Difficulty walking, Hypomobility, Increased fascial restricitons, Impaired sensation, Improper body mechanics, Postural dysfunction, Pain  Visit Diagnosis: 1. Muscle weakness (generalized)   2. Difficulty in walking, not elsewhere classified   3. Other symptoms and signs involving the musculoskeletal system   4. Other symptoms and signs involving the nervous system        Problem List Patient Active Problem List   Diagnosis Date Noted  . Acute CVA (cerebrovascular accident) (HCC) 08/05/2018  . Left leg weakness 08/05/2018  . Hyperlipidemia 08/05/2018  . Diabetes mellitus without complication (HCC) 08/05/2018  . Hypertension 08/05/2018  . Tobacco abuse 08/05/2018  . Difficulty walking 02/04/2012  . Stiffness of joints, not elsewhere classified, multiple sites 02/04/2012      Domenick Bookbinderori Romelle Muldoon PT, DPT 10/16/18, 3:04 PM [phone]  [facility] [address] [place], [place], [address] Phone: [phone]   Fax:  [phone]  Name: Alex Salazar MRN: [id number] Date of Birth: 21-Feb-1946

## 2018-10-20 ENCOUNTER — Telehealth (HOSPITAL_COMMUNITY): Payer: Self-pay

## 2018-10-20 ENCOUNTER — Ambulatory Visit (HOSPITAL_COMMUNITY): Payer: Medicare Other

## 2018-10-20 ENCOUNTER — Encounter (HOSPITAL_COMMUNITY): Payer: Self-pay

## 2018-10-20 ENCOUNTER — Other Ambulatory Visit: Payer: Self-pay

## 2018-10-20 VITALS — BP 121/77

## 2018-10-20 DIAGNOSIS — M6281 Muscle weakness (generalized): Secondary | ICD-10-CM

## 2018-10-20 DIAGNOSIS — R262 Difficulty in walking, not elsewhere classified: Secondary | ICD-10-CM

## 2018-10-20 DIAGNOSIS — R29898 Other symptoms and signs involving the musculoskeletal system: Secondary | ICD-10-CM

## 2018-10-20 DIAGNOSIS — R29818 Other symptoms and signs involving the nervous system: Secondary | ICD-10-CM

## 2018-10-20 NOTE — Therapy (Signed)
[facility] [address] [place], [place], [address] Phone: [phone]   Fax:  [phone]  Physical Therapy Treatment  Patient Details  Name: Alex Salazar MRN: [id number] Date of Birth: [birth year] Referring Provider (PT): Garey HamPeggy Cassada, MD   Encounter Date: 10/20/2018  PT End of Session - 10/20/18 1328    Visit Number  6    Number of Visits  12    Date for PT Re-Evaluation  11/04/18    Authorization Type  VA (Secondary: UHC Medicare)    Authorization Time Period  09/23/18 to 11/04/18    Authorization - Visit Number  6   reassessment completed 8/7, visit #5   Authorization - Number of Visits  15    PT Start Time  1315    PT Stop Time  1340    PT Time Calculation (min)  25 min    Activity Tolerance  Patient limited by fatigue    Behavior During Therapy  [facility] for tasks assessed/performed       Past Medical History:  Diagnosis Date  . Diabetes mellitus without complication (HCC)   . Hyperlipidemia   . Hypertension   . PE (pulmonary thromboembolism) (HCC)     Past Surgical History:  Procedure Laterality Date  . BYPASS GRAFT      Vitals:   10/20/18 1326  BP: 121/77    Subjective Assessment - 10/20/18 1321    Subjective  Pt reports the last 2 days he has been extremely "fatigued, weak, tired". Pt denies falls, dropping items, fever, dizzy. Pt reports low sugar (in the 90s when he first wakes up), but he eats and drinks OJ and the blood sugar rises but he still feels fatigued. Pt reports compliance with medication. Pt reports inability to perform walking program at home over the past 2-3 days.    Limitations  Walking;House hold activities    How long can you sit comfortably?  no issues    How long can you stand comfortably?  10-15 mins average    How long can you walk comfortably?  varies, averages 5-10 mins    Patient Stated Goals  get more energy, get legs working better, maybe hand    Currently in Pain?  No/denies     Pain Onset  More than a month ago            [facility] Treatment/Exercise - 10/20/18 0001      Knee/Hip Exercises: Standing   Heel Raises  Both;10 reps    Heel Raises Limitations  toe raises, 10 reps    Other Standing Knee Exercises  shoulder ext, GTB, x10 reps      Knee/Hip Exercises: Seated   Other Seated Knee/Hip Exercises  Seated GH flexion and abduction, RTB, 2x10 reps each    Sit to Sand  2 sets;10 reps             PT Education - 10/20/18 1327    Education Details  Educated pt to reach out to MD due to fatigue/weak feeling over the past few days, exercise technique, continue HEP    Person(s) Educated  Patient    Methods  Explanation    Comprehension  Verbalized understanding       PT Short Term Goals - 10/16/18 1404      PT SHORT TERM GOAL #1   Title  Pt will have improved BUE and BLE MMT by 1/2 grade throughout in order to demo improved overall strength and  promote return to PLOF.    Baseline  8/7: see MMT    Time  3    Period  Weeks    Status  On-going    Target Date  10/14/18      PT SHORT TERM GOAL #2   Title  Pt will be able to perform 5xSTS in 10sec or < with 0 UE support in order to demo improved functional hip strength, balance, and reduce his risk for falls.    Baseline  8/7: 18 sec    Time  3    Period  Weeks    Status  On-going      PT SHORT TERM GOAL #3   Title  Pt will have improved bil grip strength by 8# or > to demo improved overall function and maximize his functional use of BUE.    Baseline  8/7: R 65# (no change), L 53# (-2# from eval)    Time  3    Period  Weeks    Status  On-going        PT Long Term Goals - 10/16/18 1427      PT LONG TERM GOAL #1   Title  Pt will have improved BUE and BLE MMT by 1 grade throughout in order to further demo improved overall function and promote return to PLOF.    Baseline  8/7: see MMT    Time  6    Period  Weeks    Status  On-going      PT LONG TERM GOAL #2   Title  Pt will be  able to ambulate 69400ft during 3MWT with LRAD in order to demo improved BLE tolerance, endurance, and maximize return to PLOF.    Baseline  8/7: 512 ft with RW    Time  6    Period  Weeks    Status  On-going      PT LONG TERM GOAL #3   Title  Pt will be able to perform bil SLS for 6 sec or > without UE support to demo improved functional hip and ankle strength and stability in order to maximize gait and overall balance.    Baseline  8/7: R: 2 sec or <, L:<1 sec    Time  6    Period  Weeks    Status  On-going      PT LONG TERM GOAL #4   Title  Pt will report being able to stand for 25 mins to allow him to prepare a small meal and clean dishes with greater ease and demo improved BLE/BUE strength.    Baseline  8/7: 15 minutes before needing to sit and rest    Time  6    Period  Weeks    Status  On-going            Plan - 10/20/18 1351    Clinical Impression Statement  Continued with pt's established POC this date. Initiated therapy with standing BLE strengthening performing heel and toe raises, requiring seated rest break after performance. Pt able to perform seated BUE strengthening without difficulty. Pt able to perform 2x10 reps for sit to stands this date demonstrating improving strength, but demonstrates moderate fatigue after. Pt able to perform BUE and core strengthening exercises for only one set before uncontrolled lowering to sitting in chair behind pt. PT educated pt significantly about reaching out to PCP regarding decrease in functional activity and fatigue for past 2-3 days and inability to complete PT session.  Pt's BP initially 121/53mmHg and checked during session due to increasing fatigue with exercises and it was 123/13mmHg. Pt insistent on completing PT session, but due to concerns with fatigue level, therapist ended session and again educated pt to reach out to physician and therapist will also reach out to pt's physician. This therapist observed pt ambulate to car, place  RW in bed of truck, and enter vehicle without loss of balance; pt declined therapist assistance to vehicle. Continue to progress as able.    Personal Factors and Comorbidities  Comorbidity 3+    Comorbidities  see above; also reports bil neuropathy    Examination-Activity Limitations  Locomotion Level;Stand    Examination-Participation Restrictions  Community Activity;Yard Work;Cleaning    Stability/Clinical Decision Making  Stable/Uncomplicated    Rehab Potential  Fair    PT Frequency  2x / week    PT Duration  6 weeks    PT Treatment/Interventions  ADLs/Self Care Home Management;Aquatic Therapy;Cryotherapy;Electrical Stimulation;Moist Heat;Ultrasound;DME Instruction;Gait training;Stair training;Functional mobility training;Therapeutic activities;Therapeutic exercise;Balance training;Neuromuscular re-education;Patient/family education;Orthotic Fit/Training;Manual techniques;Scar mobilization;Passive range of motion;Dry needling;Energy conservation;Taping    PT Next Visit Plan  f/u about pt calling MD about fatigue for 2-3 days. Progress BLE and BUE strengthening with exercises and functional tasks. Progress balance training. Progress enruance training with NuStep/UBE.  Manual STM as needed for pain relief.    PT Home Exercise Plan  eval: bridging, SLR, seated GH flex and abd with RTB; 7/22: hip abd RTB, STS; 8/5: shoulder ext and rows with RTB and anchor over door, SKTC    Consulted and Agree with Plan of Care  Patient       Patient will benefit from skilled therapeutic intervention in order to improve the following deficits and impairments:  Abnormal gait, Decreased activity tolerance, Decreased balance, Decreased coordination, Decreased endurance, Decreased mobility, Decreased strength, Difficulty walking, Hypomobility, Increased fascial restricitons, Impaired sensation, Improper body mechanics, Postural dysfunction, Pain  Visit Diagnosis: 1. Muscle weakness (generalized)   2. Difficulty in  walking, not elsewhere classified   3. Other symptoms and signs involving the musculoskeletal system   4. Other symptoms and signs involving the nervous system        Problem List Patient Active Problem List   Diagnosis Date Noted  . Acute CVA (cerebrovascular accident) ([place]) 08/05/2018  . Left leg weakness 08/05/2018  . Hyperlipidemia 08/05/2018  . Diabetes mellitus without complication ([place]) 20/25/4270  . Hypertension 08/05/2018  . Tobacco abuse 08/05/2018  . Difficulty walking 02/04/2012  . Stiffness of joints, not elsewhere classified, multiple sites 02/04/2012      Alex Salazar PT, DPT 10/20/18, 2:00 PM [place], [place], [address] Phone: [phone]   Fax:  [phone]  Name: Alex Salazar MRN: [id number] Date of Birth: 05/29/1945

## 2018-10-20 NOTE — Telephone Encounter (Signed)
This therapist called pt's referring doctor Cleophas Dunker, MD) and left message with receptionist regarding pt's physical state during therapy session today. Left message to inform MD of pt's fatigue/weakness over the past 2-3 days and inability to complete therapy session, educated pt to reach out to MD but didn't seem receptive to the idea. Receptionist verbalized understanding, took message, and received this therapist's call back number if needed by MD. Call completed at 3:13pm on  10/20/18.  Talbot Grumbling PT, DPT 10/20/18, 3:16 PM [phone]

## 2018-10-22 ENCOUNTER — Encounter (HOSPITAL_COMMUNITY): Payer: Self-pay

## 2018-10-22 ENCOUNTER — Other Ambulatory Visit: Payer: Self-pay

## 2018-10-22 ENCOUNTER — Ambulatory Visit (HOSPITAL_COMMUNITY): Payer: Medicare Other

## 2018-10-22 DIAGNOSIS — M6281 Muscle weakness (generalized): Secondary | ICD-10-CM | POA: Diagnosis not present

## 2018-10-22 DIAGNOSIS — R29818 Other symptoms and signs involving the nervous system: Secondary | ICD-10-CM

## 2018-10-22 DIAGNOSIS — R262 Difficulty in walking, not elsewhere classified: Secondary | ICD-10-CM

## 2018-10-22 DIAGNOSIS — R29898 Other symptoms and signs involving the musculoskeletal system: Secondary | ICD-10-CM

## 2018-10-22 NOTE — Therapy (Signed)
[facility] [address] [place], [place], [address] Phone: [phone]   Fax:  [phone]  Physical Therapy Treatment  Patient Details  Name: Alex Salazar MRN: [id number] Date of Birth: 09-01-1945 Referring Provider (PT): Garey HamPeggy Cassada, MD   Encounter Date: 10/22/2018  PT End of Session - 10/22/18 1421    Visit Number  7    Number of Visits  12    Date for PT Re-Evaluation  11/04/18   Reassessment visit 5, on 10/16/18   Authorization Type  [place] (Secondary: UHC Medicare)    Authorization Time Period  09/23/18 to 11/04/18    Authorization - Visit Number  7    Authorization - Number of Visits  15    PT Start Time  1418    PT Stop Time  1502   5' on Nustep, not included in charges   PT Time Calculation (min)  44 min    Activity Tolerance  Patient tolerated treatment well    Behavior During Therapy  [facility] for tasks assessed/performed       Past Medical History:  Diagnosis Date  . Diabetes mellitus without complication (HCC)   . Hyperlipidemia   . Hypertension   . PE (pulmonary thromboembolism) (HCC)     Past Surgical History:  Procedure Laterality Date  . BYPASS GRAFT      There were no vitals filed for this visit.  Subjective Assessment - 10/22/18 1415    Subjective  Pt stated he is feeling much better today, increased energy today.  Reports he has called and left 2 messages with MD, hasn't gotten a call back yet.  Blood sugar at 133 today    Currently in Pain?  No/denies                       [facility] Adult PT Treatment/Exercise - 10/22/18 0001      Exercises   Exercises  Knee/Hip      Knee/Hip Exercises: Aerobic   Nustep  5', L3, resistance 3; SPM>90      Knee/Hip Exercises: Standing   Heel Raises  Both;10 reps;2 sets    Heel Raises Limitations  toe raises, 10 reps    Lateral Step Up  Both;Hand Hold: 2;Step Height: 6";15 reps    Forward Step Up  Both;Hand Hold: 1;Step Height: 6";15 reps    Step Down   Both;10 reps;Hand Hold: 1;Step Height: 6"      Knee/Hip Exercises: Seated   Sit to Sand  10 reps;without UE support   eccentric control         Balance Exercises - 10/22/18 1446      Balance Exercises: Standing   Tandem Stance  Eyes open;5 reps;30 secs    SLS  Eyes open;Solid surface;3 reps    SLS with Vectors  3 reps;Upper extremity assist 2   3x 5" BLE wiht UE support   Sidestepping  4 reps;Theraband   4RT in //bars with RTB, no HHA   Marching Limitations  cone tapping 10x alternating with intermittent HHA          PT Short Term Goals - 10/16/18 1404      PT SHORT TERM GOAL #1   Title  Pt will have improved BUE and BLE MMT by 1/2 grade throughout in order to demo improved overall strength and promote return to PLOF.    Baseline  8/7: see MMT    Time  3    Period  Weeks    Status  On-going    Target Date  10/14/18      PT SHORT TERM GOAL #2   Title  Pt will be able to perform 5xSTS in 10sec or < with 0 UE support in order to demo improved functional hip strength, balance, and reduce his risk for falls.    Baseline  8/7: 18 sec    Time  3    Period  Weeks    Status  On-going      PT SHORT TERM GOAL #3   Title  Pt will have improved bil grip strength by 8# or > to demo improved overall function and maximize his functional use of BUE.    Baseline  8/7: R 65# (no change), L 53# (-2# from eval)    Time  3    Period  Weeks    Status  On-going        PT Long Term Goals - 10/16/18 1427      PT LONG TERM GOAL #1   Title  Pt will have improved BUE and BLE MMT by 1 grade throughout in order to further demo improved overall function and promote return to PLOF.    Baseline  8/7: see MMT    Time  6    Period  Weeks    Status  On-going      PT LONG TERM GOAL #2   Title  Pt will be able to ambulate 620ft during 3MWT with LRAD in order to demo improved BLE tolerance, endurance, and maximize return to PLOF.    Baseline  8/7: 512 ft with RW    Time  6    Period   Weeks    Status  On-going      PT LONG TERM GOAL #3   Title  Pt will be able to perform bil SLS for 6 sec or > without UE support to demo improved functional hip and ankle strength and stability in order to maximize gait and overall balance.    Baseline  8/7: R: 2 sec or <, L:<1 sec    Time  6    Period  Weeks    Status  On-going      PT LONG TERM GOAL #4   Title  Pt will report being able to stand for 25 mins to allow him to prepare a small meal and clean dishes with greater ease and demo improved BLE/BUE strength.    Baseline  8/7: 15 minutes before needing to sit and rest    Time  6    Period  Weeks    Status  On-going            Plan - 10/22/18 1509    Clinical Impression Statement  Pt arrived with reoprts of vast improvements with energy today, reports he called and left 2 message without call back yet.  Continued with established POC focusing on BLE and posture strengthening and progressed balance training activies.  Pt progressing well with all exercises, able to increase step height with step up training.  Pt with increased difficulty during balance training, pt have to step out of NBOS during tandem stance activities or use of UE A to regain balance.  Pt with frequent cueing to look up and improve posture to assist with visual awareness.  Pt required 1-2 seated rest breaks during session.  EOS on nustep, increased resistance and able to complete >90 SPM.  No reports of pain through session,  was limited by fatigue wiht activities.    Personal Factors and Comorbidities  Comorbidity 3+    Comorbidities  see above; also reports bil neuropathy    Examination-Activity Limitations  Locomotion Level;Stand    Examination-Participation Restrictions  Community Activity;Yard Work;Cleaning    Stability/Clinical Decision Making  Stable/Uncomplicated    Clinical Decision Making  Low    Rehab Potential  Fair    PT Frequency  2x / week    PT Duration  6 weeks    PT Treatment/Interventions   ADLs/Self Care Home Management;Aquatic Therapy;Cryotherapy;Electrical Stimulation;Moist Heat;Ultrasound;DME Instruction;Gait training;Stair training;Functional mobility training;Therapeutic activities;Therapeutic exercise;Balance training;Neuromuscular re-education;Patient/family education;Orthotic Fit/Training;Manual techniques;Scar mobilization;Passive range of motion;Dry needling;Energy conservation;Taping    PT Next Visit Plan  f/u on MD calling pt back about fatigue for 2-3 days. Progress BLE and BUE strengthening with exercises and functional tasks. Progress balance training. Progress enruance training with NuStep/UBE.  Manual STM as needed for pain relief.    PT Home Exercise Plan  eval: bridging, SLR, seated GH flex and abd with RTB; 7/22: hip abd RTB, STS; 8/5: shoulder ext and rows with RTB and anchor over door, [facility]       Patient will benefit from skilled therapeutic intervention in order to improve the following deficits and impairments:  Abnormal gait, Decreased activity tolerance, Decreased balance, Decreased coordination, Decreased endurance, Decreased mobility, Decreased strength, Difficulty walking, Hypomobility, Increased fascial restricitons, Impaired sensation, Improper body mechanics, Postural dysfunction, Pain  Visit Diagnosis: 1. Muscle weakness (generalized)   2. Difficulty in walking, not elsewhere classified   3. Other symptoms and signs involving the musculoskeletal system   4. Other symptoms and signs involving the nervous system        Problem List Patient Active Problem List   Diagnosis Date Noted  . Acute CVA (cerebrovascular accident) (HCC) 08/05/2018  . Left leg weakness 08/05/2018  . Hyperlipidemia 08/05/2018  . Diabetes mellitus without complication (HCC) 08/05/2018  . Hypertension 08/05/2018  . Tobacco abuse 08/05/2018  . Difficulty walking 02/04/2012  . Stiffness of joints, not elsewhere classified, multiple sites 02/04/2012   Becky Saxasey Mariaceleste Herrera, LPTA;  CBIS [phone]  Juel BurrowCockerham, Alenna Russell Jo 10/22/2018, 3:18 PM  [facility] [address] [place], [place], [address] Phone: [phone]   Fax:  [phone]  Name: Alex Salazar MRN: [id number] Date of Birth: Apr 30, 1945

## 2018-10-26 ENCOUNTER — Encounter (HOSPITAL_COMMUNITY): Payer: Self-pay | Admitting: Physical Therapy

## 2018-10-26 ENCOUNTER — Ambulatory Visit (HOSPITAL_COMMUNITY): Payer: Medicare Other | Admitting: Physical Therapy

## 2018-10-26 ENCOUNTER — Other Ambulatory Visit: Payer: Self-pay

## 2018-10-26 DIAGNOSIS — R29898 Other symptoms and signs involving the musculoskeletal system: Secondary | ICD-10-CM

## 2018-10-26 DIAGNOSIS — M6281 Muscle weakness (generalized): Secondary | ICD-10-CM | POA: Diagnosis not present

## 2018-10-26 DIAGNOSIS — R262 Difficulty in walking, not elsewhere classified: Secondary | ICD-10-CM

## 2018-10-26 DIAGNOSIS — R29818 Other symptoms and signs involving the nervous system: Secondary | ICD-10-CM

## 2018-10-26 NOTE — Therapy (Signed)
[facility] [address] [place], [place], [address] Phone: [phone]   Fax:  [phone]  Physical Therapy Treatment  Patient Details  Name: Alex Salazar MRN: [id number] Date of Birth: 1945-03-25 Referring Provider (PT): Garey HamPeggy Cassada, MD   Encounter Date: 10/26/2018  PT End of Session - 10/26/18 1451    Visit Number  8    Number of Visits  12    Date for PT Re-Evaluation  11/04/18   Reassessment visit 5, on 10/16/18   Authorization Type  VA (Secondary: UHC Medicare)    Authorization Time Period  09/23/18 to 11/04/18    Authorization - Visit Number  8    Authorization - Number of Visits  15    PT Start Time  1440   Patient arrived late   PT Stop Time  1515    PT Time Calculation (min)  35 min    Activity Tolerance  Patient tolerated treatment well    Behavior During Therapy  [facility] for tasks assessed/performed       Past Medical History:  Diagnosis Date  . Diabetes mellitus without complication (HCC)   . Hyperlipidemia   . Hypertension   . PE (pulmonary thromboembolism) (HCC)     Past Surgical History:  Procedure Laterality Date  . BYPASS GRAFT      There were no vitals filed for this visit.  Subjective Assessment - 10/26/18 1446    Subjective  Patient reported feeling okay. Stated he still has not been able to reach his MD.    Currently in Pain?  No/denies                       [facility]/Exercise - 10/26/18 0001      Knee/Hip Exercises: Aerobic   Nustep  5', L3, resistance 3; SPM>90      Knee/Hip Exercises: Standing   Heel Raises  Both;10 reps;2 sets    Heel Raises Limitations  toe raises, 10 reps    Lateral Step Up  Both;Hand Hold: 2;Step Height: 6";15 reps    Forward Step Up  Both;Hand Hold: 1;Step Height: 6";15 reps    Step Down  Both;10 reps;Hand Hold: 1;Step Height: 6"      Knee/Hip Exercises: Seated   Other Seated Knee/Hip Exercises  Seated marching over 6'' hurdle x 10  each    Sit to Sand  10 reps;without UE support   Eccentric control              PT Short Term Goals - 10/16/18 1404      PT SHORT TERM GOAL #1   Title  Pt will have improved BUE and BLE MMT by 1/2 grade throughout in order to demo improved overall strength and promote return to PLOF.    Baseline  8/7: see MMT    Time  3    Period  Weeks    Status  On-going    Target Date  10/14/18      PT SHORT TERM GOAL #2   Title  Pt will be able to perform 5xSTS in 10sec or < with 0 UE support in order to demo improved functional hip strength, balance, and reduce his risk for falls.    Baseline  8/7: 18 sec    Time  3    Period  Weeks    Status  On-going      PT SHORT TERM GOAL #3   Title  Pt  will have improved bil grip strength by 8# or > to demo improved overall function and maximize his functional use of BUE.    Baseline  8/7: R 65# (no change), L 53# (-2# from eval)    Time  3    Period  Weeks    Status  On-going        PT Long Term Goals - 10/16/18 1427      PT LONG TERM GOAL #1   Title  Pt will have improved BUE and BLE MMT by 1 grade throughout in order to further demo improved overall function and promote return to PLOF.    Baseline  8/7: see MMT    Time  6    Period  Weeks    Status  On-going      PT LONG TERM GOAL #2   Title  Pt will be able to ambulate 66500ft during 3MWT with LRAD in order to demo improved BLE tolerance, endurance, and maximize return to PLOF.    Baseline  8/7: 512 ft with RW    Time  6    Period  Weeks    Status  On-going      PT LONG TERM GOAL #3   Title  Pt will be able to perform bil SLS for 6 sec or > without UE support to demo improved functional hip and ankle strength and stability in order to maximize gait and overall balance.    Baseline  8/7: R: 2 sec or <, L:<1 sec    Time  6    Period  Weeks    Status  On-going      PT LONG TERM GOAL #4   Title  Pt will report being able to stand for 25 mins to allow him to prepare a small  meal and clean dishes with greater ease and demo improved BLE/BUE strength.    Baseline  8/7: 15 minutes before needing to sit and rest    Time  6    Period  Weeks    Status  On-going            Plan - 10/26/18 1530    Clinical Impression Statement  Patient arrived late and therefore session was limited due to this. This session continued with established plan of care. Patient with improved energy level this session. This session progressed patient to seated marching over 6'' hurdle. Patient did eventually knock over the hurdle while performing this session. Plan to continue with lower and upper extremity exercises to improve overall functional mobility.    Personal Factors and Comorbidities  Comorbidity 3+    Comorbidities  see above; also reports bil neuropathy    Examination-Activity Limitations  Locomotion Level;Stand    Examination-Participation Restrictions  Community Activity;Yard Work;Cleaning    Stability/Clinical Decision Making  Stable/Uncomplicated    Rehab Potential  Fair    PT Frequency  2x / week    PT Duration  6 weeks    PT Treatment/Interventions  ADLs/Self Care Home Management;Aquatic Therapy;Cryotherapy;Electrical Stimulation;Moist Heat;Ultrasound;DME Instruction;Gait training;Stair training;Functional mobility training;Therapeutic activities;Therapeutic exercise;Balance training;Neuromuscular re-education;Patient/family education;Orthotic Fit/Training;Manual techniques;Scar mobilization;Passive range of motion;Dry needling;Energy conservation;Taping    PT Next Visit Plan  f/u on MD calling pt back about fatigue for 2-3 days. Progress BLE and BUE strengthening with exercises and functional tasks. Progress balance training. Progress enruance training with NuStep/UBE.  Manual STM as needed for pain relief.    PT Home Exercise Plan  eval: bridging, SLR, seated GH flex and abd  with RTB; 7/22: hip abd RTB, STS; 8/5: shoulder ext and rows with RTB and anchor over door, [place]        Patient will benefit from skilled therapeutic intervention in order to improve the following deficits and impairments:  Abnormal gait, Decreased activity tolerance, Decreased balance, Decreased coordination, Decreased endurance, Decreased mobility, Decreased strength, Difficulty walking, Hypomobility, Increased fascial restricitons, Impaired sensation, Improper body mechanics, Postural dysfunction, Pain  Visit Diagnosis: 1. Muscle weakness (generalized)   2. Difficulty in walking, not elsewhere classified   3. Other symptoms and signs involving the musculoskeletal system   4. Other symptoms and signs involving the nervous system        Problem List Patient Active Problem List   Diagnosis Date Noted  . Acute CVA (cerebrovascular accident) ([place]) 08/05/2018  . Left leg weakness 08/05/2018  . Hyperlipidemia 08/05/2018  . Diabetes mellitus without complication ([place]) 38/93/7342  . Hypertension 08/05/2018  . Tobacco abuse 08/05/2018  . Difficulty walking 02/04/2012  . Stiffness of joints, not elsewhere classified, multiple sites 02/04/2012   Alex Salazar PT, DPT 3:31 PM, 10/26/18 [place], [place], [address] Phone: [phone]   Fax:  [phone]  Name: Alex Salazar MRN: [id number] Date of Birth: August 28, 1945

## 2018-10-29 ENCOUNTER — Other Ambulatory Visit: Payer: Self-pay

## 2018-10-29 ENCOUNTER — Encounter (HOSPITAL_COMMUNITY): Payer: Self-pay | Admitting: Physical Therapy

## 2018-10-29 ENCOUNTER — Ambulatory Visit (HOSPITAL_COMMUNITY): Payer: Medicare Other | Admitting: Physical Therapy

## 2018-10-29 DIAGNOSIS — R29898 Other symptoms and signs involving the musculoskeletal system: Secondary | ICD-10-CM

## 2018-10-29 DIAGNOSIS — M6281 Muscle weakness (generalized): Secondary | ICD-10-CM

## 2018-10-29 DIAGNOSIS — R29818 Other symptoms and signs involving the nervous system: Secondary | ICD-10-CM

## 2018-10-29 DIAGNOSIS — R262 Difficulty in walking, not elsewhere classified: Secondary | ICD-10-CM

## 2018-10-29 NOTE — Therapy (Signed)
[facility] [address] [place], [place], [address] Phone: [phone]   Fax:  [phone]  Physical Therapy Treatment  Patient Details  Name: Alex Salazar MRN: [id number] Date of Birth: [birth year] Referring Provider (PT): Garey HamPeggy Cassada, MD   Encounter Date: 10/29/2018  PT End of Session - 10/29/18 1600    Visit Number  9    Number of Visits  12    Date for PT Re-Evaluation  11/04/18   Reassessment visit 5, on 10/16/18   Authorization Type  VA (Secondary: UHC Medicare)    Authorization Time Period  09/23/18 to 11/04/18    Authorization - Visit Number  9    Authorization - Number of Visits  15    PT Start Time  1538    PT Stop Time  1620    PT Time Calculation (min)  42 min    Activity Tolerance  Patient tolerated treatment well    Behavior During Therapy  [facility] for tasks assessed/performed       Past Medical History:  Diagnosis Date  . Diabetes mellitus without complication (HCC)   . Hyperlipidemia   . Hypertension   . PE (pulmonary thromboembolism) (HCC)     Past Surgical History:  Procedure Laterality Date  . BYPASS GRAFT      There were no vitals filed for this visit.  Subjective Assessment - 10/29/18 1543    Subjective  Patient reported that his MD's office did call back and set up an appointment for 11/09/18.    Currently in Pain?  No/denies                       [facility] Treatment/Exercise - 10/29/18 0001      Knee/Hip Exercises: Aerobic   Nustep  5.5', L3, resistance 3; SPM>90      Knee/Hip Exercises: Standing   Heel Raises  Both;10 reps;2 sets    Heel Raises Limitations  toe raises, 10 reps    Lateral Step Up  Both;Hand Hold: 2;Step Height: 6";15 reps    Forward Step Up  Both;Hand Hold: 1;Step Height: 6";15 reps    Step Down  Both;10 reps;Hand Hold: 1;Step Height: 6"      Knee/Hip Exercises: Seated   Other Seated Knee/Hip Exercises  Seated marching over 6'' hurdle x 10 each    Sit  to Sand  10 reps;without UE support   Eccentric control         Balance Exercises - 10/29/18 1558      Balance Exercises: Standing   Other Standing Exercises  Semi-tandem inside // bars with overhead shoulder flexion with 3# bar x 15 each LE forward          PT Short Term Goals - 10/16/18 1404      PT SHORT TERM GOAL #1   Title  Pt will have improved BUE and BLE MMT by 1/2 grade throughout in order to demo improved overall strength and promote return to PLOF.    Baseline  8/7: see MMT    Time  3    Period  Weeks    Status  On-going    Target Date  10/14/18      PT SHORT TERM GOAL #2   Title  Pt will be able to perform 5xSTS in 10sec or < with 0 UE support in order to demo improved functional hip strength, balance, and reduce his risk for falls.    Baseline  8/7: 18 sec    Time  3    Period  Weeks    Status  On-going      PT SHORT TERM GOAL #3   Title  Pt will have improved bil grip strength by 8# or > to demo improved overall function and maximize his functional use of BUE.    Baseline  8/7: R 65# (no change), L 53# (-2# from eval)    Time  3    Period  Weeks    Status  On-going        PT Long Term Goals - 10/16/18 1427      PT LONG TERM GOAL #1   Title  Pt will have improved BUE and BLE MMT by 1 grade throughout in order to further demo improved overall function and promote return to PLOF.    Baseline  8/7: see MMT    Time  6    Period  Weeks    Status  On-going      PT LONG TERM GOAL #2   Title  Pt will be able to ambulate 664ft during 3MWT with LRAD in order to demo improved BLE tolerance, endurance, and maximize return to PLOF.    Baseline  8/7: 512 ft with RW    Time  6    Period  Weeks    Status  On-going      PT LONG TERM GOAL #3   Title  Pt will be able to perform bil SLS for 6 sec or > without UE support to demo improved functional hip and ankle strength and stability in order to maximize gait and overall balance.    Baseline  8/7: R: 2 sec or  <, L:<1 sec    Time  6    Period  Weeks    Status  On-going      PT LONG TERM GOAL #4   Title  Pt will report being able to stand for 25 mins to allow him to prepare a small meal and clean dishes with greater ease and demo improved BLE/BUE strength.    Baseline  8/7: 15 minutes before needing to sit and rest    Time  6    Period  Weeks    Status  On-going            Plan - 10/29/18 1628    Clinical Impression Statement  Continued with established plan of care this session. This session added semi-tandem with shoulder flexion inside parallel bars to improve balance and upper extremity endurance and strength. Patient able to maintain form for majority of exercise although a couple times taking small steps to regain balance. This session increased time on Nustep to 5.5 minutes. Patient would continue to benefit from skilled physical therapy in order to continue progressing towards functional goals.    Personal Factors and Comorbidities  Comorbidity 3+    Comorbidities  see above; also reports bil neuropathy    Examination-Activity Limitations  Locomotion Level;Stand    Examination-Participation Restrictions  Community Activity;Yard Work;Cleaning    Stability/Clinical Decision Making  Stable/Uncomplicated    Rehab Potential  Fair    PT Frequency  2x / week    PT Duration  6 weeks    PT Treatment/Interventions  ADLs/Self Care Home Management;Aquatic Therapy;Cryotherapy;Electrical Stimulation;Moist Heat;Ultrasound;DME Instruction;Gait training;Stair training;Functional mobility training;Therapeutic activities;Therapeutic exercise;Balance training;Neuromuscular re-education;Patient/family education;Orthotic Fit/Training;Manual techniques;Scar mobilization;Passive range of motion;Dry needling;Energy conservation;Taping    PT Next Visit Plan  Progress BLE and BUE strengthening with  exercises and functional tasks. Progress balance training. Progress enruance training with NuStep/UBE.  Manual STM  as needed for pain relief.    PT Home Exercise Plan  eval: bridging, SLR, seated GH flex and abd with RTB; 7/22: hip abd RTB, STS; 8/5: shoulder ext and rows with RTB and anchor over door, [facility]       Patient will benefit from skilled therapeutic intervention in order to improve the following deficits and impairments:  Abnormal gait, Decreased activity tolerance, Decreased balance, Decreased coordination, Decreased endurance, Decreased mobility, Decreased strength, Difficulty walking, Hypomobility, Increased fascial restricitons, Impaired sensation, Improper body mechanics, Postural dysfunction, Pain  Visit Diagnosis: Muscle weakness (generalized)  Difficulty in walking, not elsewhere classified  Other symptoms and signs involving the musculoskeletal system  Other symptoms and signs involving the nervous system     Problem List Patient Active Problem List   Diagnosis Date Noted  . Acute CVA (cerebrovascular accident) (HCC) 08/05/2018  . Left leg weakness 08/05/2018  . Hyperlipidemia 08/05/2018  . Diabetes mellitus without complication (HCC) 08/05/2018  . Hypertension 08/05/2018  . Tobacco abuse 08/05/2018  . Difficulty walking 02/04/2012  . Stiffness of joints, not elsewhere classified, multiple sites 02/04/2012   Verne CarrowMacy Jermari Tamargo PT, DPT 4:29 PM, 10/29/18 [phone]  [facility] [address] [place], [place], [address] Phone: [phone]   Fax:  [phone]  Name: Alex Salazar MRN: [id number] Date of Birth: 07/23/1945

## 2018-11-02 ENCOUNTER — Ambulatory Visit (HOSPITAL_COMMUNITY): Payer: Medicare Other | Admitting: Physical Therapy

## 2018-11-02 ENCOUNTER — Encounter (HOSPITAL_COMMUNITY): Payer: Self-pay | Admitting: Physical Therapy

## 2018-11-02 ENCOUNTER — Other Ambulatory Visit: Payer: Self-pay

## 2018-11-02 DIAGNOSIS — R262 Difficulty in walking, not elsewhere classified: Secondary | ICD-10-CM

## 2018-11-02 DIAGNOSIS — M6281 Muscle weakness (generalized): Secondary | ICD-10-CM | POA: Diagnosis not present

## 2018-11-02 DIAGNOSIS — R29818 Other symptoms and signs involving the nervous system: Secondary | ICD-10-CM

## 2018-11-02 DIAGNOSIS — R29898 Other symptoms and signs involving the musculoskeletal system: Secondary | ICD-10-CM

## 2018-11-02 NOTE — Therapy (Signed)
[place], [place], [address] Phone: [phone]   Fax:  [phone]  Physical Therapy Treatment  Patient Details  Name: Alex Salazar MRN: [id number] Date of Birth: May 23, 1945 Referring Provider (PT): Cleophas Dunker, MD   Encounter Date: 11/02/2018  PT End of Session - 11/02/18 1533    Visit Number  10    Number of Visits  12    Date for PT Re-Evaluation  11/04/18   Reassessment visit 5, on 10/16/18   Authorization Type  [place] (Secondary: UHC Medicare)    Authorization Time Period  09/23/18 to 11/04/18    Authorization - Visit Number  10    Authorization - Number of Visits  15    PT Start Time  3875    PT Stop Time  1608    PT Time Calculation (min)  41 min    Activity Tolerance  Patient tolerated treatment well    Behavior During Therapy  [facility] for tasks assessed/performed       Past Medical History:  Diagnosis Date  . Diabetes mellitus without complication ([place])   . Hyperlipidemia   . Hypertension   . PE (pulmonary thromboembolism) ([place])     Past Surgical History:  Procedure Laterality Date  . BYPASS GRAFT      There were no vitals filed for this visit.  Subjective Assessment - 11/02/18 1532    Subjective  Patient reported aching leg pain and today which he rates as an 8/10.    Currently in Pain?  Yes    Pain Score  8     Pain Location  Leg    Pain Orientation  Right;Left    Pain Descriptors / Indicators  Aching    Pain Type  Chronic pain                       [facility] Adult PT Treatment/Exercise - 11/02/18 0001      Knee/Hip Exercises: Stretches   Other Knee/Hip Stretches  Rolling physioball out 10x5'' medium physioball      Knee/Hip Exercises: Standing   Heel Raises  Both;10 reps;2 sets    Heel Raises Limitations  toe raises, 10 reps    Lateral Step Up  Both;Hand Hold: 2;Step Height: 6";15 reps    Forward Step Up  Both;Hand Hold: 1;Step Height: 6";15 reps    Step Down   Both;10 reps;Hand Hold: 1;Step Height: 6"      Knee/Hip Exercises: Seated   Other Seated Knee/Hip Exercises  Seated marching over 6'' hurdle x 10 each    Sit to Sand  10 reps;without UE support;2 sets   With step towards 2 different cones             PT Education - 11/02/18 1533    Education Details  Educated on purpose and technique of interventions throughout session.    Person(s) Educated  Patient    Methods  Explanation    Comprehension  Verbalized understanding       PT Short Term Goals - 10/16/18 1404      PT SHORT TERM GOAL #1   Title  Pt will have improved BUE and BLE MMT by 1/2 grade throughout in order to demo improved overall strength and promote return to PLOF.    Baseline  8/7: see MMT    Time  3    Period  Weeks    Status  On-going    Target Date  10/14/18      PT SHORT TERM GOAL #2   Title  Pt will be able to perform 5xSTS in 10sec or < with 0 UE support in order to demo improved functional hip strength, balance, and reduce his risk for falls.    Baseline  8/7: 18 sec    Time  3    Period  Weeks    Status  On-going      PT SHORT TERM GOAL #3   Title  Pt will have improved bil grip strength by 8# or > to demo improved overall function and maximize his functional use of BUE.    Baseline  8/7: R 65# (no change), L 53# (-2# from eval)    Time  3    Period  Weeks    Status  On-going        PT Long Term Goals - 10/16/18 1427      PT LONG TERM GOAL #1   Title  Pt will have improved BUE and BLE MMT by 1 grade throughout in order to further demo improved overall function and promote return to PLOF.    Baseline  8/7: see MMT    Time  6    Period  Weeks    Status  On-going      PT LONG TERM GOAL #2   Title  Pt will be able to ambulate 69500ft during 3MWT with LRAD in order to demo improved BLE tolerance, endurance, and maximize return to PLOF.    Baseline  8/7: 512 ft with RW    Time  6    Period  Weeks    Status  On-going      PT LONG TERM GOAL #3    Title  Pt will be able to perform bil SLS for 6 sec or > without UE support to demo improved functional hip and ankle strength and stability in order to maximize gait and overall balance.    Baseline  8/7: R: 2 sec or <, L:<1 sec    Time  6    Period  Weeks    Status  On-going      PT LONG TERM GOAL #4   Title  Pt will report being able to stand for 25 mins to allow him to prepare a small meal and clean dishes with greater ease and demo improved BLE/BUE strength.    Baseline  8/7: 15 minutes before needing to sit and rest    Time  6    Period  Weeks    Status  On-going            Plan - 11/02/18 1619    Clinical Impression Statement  Patient reported low back discomfort and leg pain upon entering session. Initiated session with lumbar stretch to decrease pain and improve tolerance to standing activities. This session added sit to stand with forward step towards cones as targets to work on balance functional strengthening and proprioception. This session ended on Nustep and increased resistance to 4 this session and monitored throughout for tolerance. Plan to re-assess patient next session.    Personal Factors and Comorbidities  Comorbidity 3+    Comorbidities  see above; also reports bil neuropathy    Examination-Activity Limitations  Locomotion Level;Stand    Examination-Participation Restrictions  Community Activity;Yard Work;Cleaning    Stability/Clinical Decision Making  Stable/Uncomplicated    Rehab Potential  Fair    PT Frequency  2x / week    PT Duration  6 weeks    PT Treatment/Interventions  ADLs/Self Care Home Management;Aquatic Therapy;Cryotherapy;Electrical Stimulation;Moist Heat;Ultrasound;DME Instruction;Gait training;Stair training;Functional mobility training;Therapeutic activities;Therapeutic exercise;Balance training;Neuromuscular re-education;Patient/family education;Orthotic Fit/Training;Manual techniques;Scar mobilization;Passive range of motion;Dry  needling;Energy conservation;Taping    PT Next Visit Plan  Re-assess    PT Home Exercise Plan  eval: bridging, SLR, seated GH flex and abd with RTB; 7/22: hip abd RTB, STS; 8/5: shoulder ext and rows with RTB and anchor over door, SKTC       Patient will benefit from skilled therapeutic intervention in order to improve the following deficits and impairments:  Abnormal gait, Decreased activity tolerance, Decreased balance, Decreased coordination, Decreased endurance, Decreased mobility, Decreased strength, Difficulty walking, Hypomobility, Increased fascial restricitons, Impaired sensation, Improper body mechanics, Postural dysfunction, Pain  Visit Diagnosis: Muscle weakness (generalized)  Difficulty in walking, not elsewhere classified  Other symptoms and signs involving the musculoskeletal system  Other symptoms and signs involving the nervous system     Problem List Patient Active Problem List   Diagnosis Date Noted  . Acute CVA (cerebrovascular accident) (HCC) 08/05/2018  . Left leg weakness 08/05/2018  . Hyperlipidemia 08/05/2018  . Diabetes mellitus without complication (HCC) 08/05/2018  . Hypertension 08/05/2018  . Tobacco abuse 08/05/2018  . Difficulty walking 02/04/2012  . Stiffness of joints, not elsewhere classified, multiple sites 02/04/2012    Verne CarrowMacy Bravlio Luca PT, DPT 4:20 PM, 11/02/18 [phone]  [facility] [address] [place], [place], [address] Phone: [phone]   Fax:  [phone]  Name: Alex Salazar MRN: [id number] Date of Birth: [birth year]

## 2018-11-05 ENCOUNTER — Encounter (HOSPITAL_COMMUNITY): Payer: Self-pay | Admitting: Physical Therapy

## 2018-11-05 ENCOUNTER — Ambulatory Visit (HOSPITAL_COMMUNITY): Payer: Medicare Other | Admitting: Physical Therapy

## 2018-11-05 ENCOUNTER — Other Ambulatory Visit: Payer: Self-pay

## 2018-11-05 DIAGNOSIS — R29818 Other symptoms and signs involving the nervous system: Secondary | ICD-10-CM

## 2018-11-05 DIAGNOSIS — M6281 Muscle weakness (generalized): Secondary | ICD-10-CM

## 2018-11-05 DIAGNOSIS — R262 Difficulty in walking, not elsewhere classified: Secondary | ICD-10-CM

## 2018-11-05 DIAGNOSIS — R29898 Other symptoms and signs involving the musculoskeletal system: Secondary | ICD-10-CM

## 2018-11-05 NOTE — Patient Instructions (Addendum)
Alternating Step    Stand at a countertop. Take alternating steps as quickly as possible. Hold for 3 seconds.  Repeat __20__ times. Do __1-2__ sessions per day.  http://gt2.exer.us/493   Copyright  VHI. All rights reserved.  Heel Raises    Stand with support. Tighten pelvic floor and hold. With knees straight, raise heels off ground. Hold _2-3__ seconds. Relax for __5_ seconds. Repeat _10__ times. Repeat a second set. Do _1-2__ times a day.  Copyright  VHI. All rights reserved.

## 2018-11-05 NOTE — Therapy (Signed)
[facility] [address] [place], [place], [address] Phone: [phone]   Fax:  [phone]  Physical Therapy Treatment / Re-assessment / Discharge Summary  Patient Details  Name: Alex Salazar MRN: [id number] Date of Birth: 08/14/1945 Referring Provider (PT): Cleophas Dunker, MD   Encounter Date: 11/05/2018   Progress Note Reporting Period 10/17/18 to 11/05/18  See note below for Objective Data and Assessment of Progress/Goals.       PHYSICAL THERAPY DISCHARGE SUMMARY  Visits from Start of Care: 11  Current functional level related to goals / functional outcomes: See below   Remaining deficits: See below   Education / Equipment: Continue HEP, call with any questions or concerns Plan: Patient agrees to discharge.  Patient goals were partially met. Patient is being discharged due to                                                     ?????  Achieving some goals and plateauing in some areas.         PT End of Session - 11/05/18 1650    Visit Number  11    Number of Visits  12    Date for PT Re-Evaluation  11/04/18   Reassessment visit 5, on 10/16/18   Authorization Type  VA (Secondary: UHC Medicare)    Authorization Time Period  09/23/18 to 11/04/18    Authorization - Visit Number  11    Authorization - Number of Visits  15    PT Start Time  1535    PT Stop Time  1625    PT Time Calculation (min)  50 min    Equipment Utilized During Treatment  Gait belt    Activity Tolerance  Patient tolerated treatment well    Behavior During Therapy  WFL for tasks assessed/performed       Past Medical History:  Diagnosis Date  . Diabetes mellitus without complication ([place])   . Hyperlipidemia   . Hypertension   . PE (pulmonary thromboembolism) ([place])     Past Surgical History:  Procedure Laterality Date  . BYPASS GRAFT      There were no vitals filed for this visit.  Subjective Assessment - 11/05/18 1538    Subjective   Patient reported leg pain which he rated as 5/10.    How long can you stand comfortably?  10 minutes    Currently in Pain?  Yes    Pain Score  5     Pain Location  Leg    Pain Orientation  Right;Left    Pain Descriptors / Indicators  Aching    Pain Type  Chronic pain    Pain Onset  More than a month ago         [facility] PT Assessment - 11/05/18 0001      Assessment   Medical Diagnosis  Weakness    Referring Provider (PT)  Cleophas Dunker, MD    Onset Date/Surgical Date  08/05/18      Coordination   Finger Nose Finger Test  WNL    Heel Shin Test  WNL      Strength   Right Shoulder Flexion  5/5   was 5   Right Shoulder ABduction  5/5   was 5   Left Shoulder Flexion  5/5   was  5   Left Shoulder ABduction  5/5   was 4   Right Elbow Flexion  5/5   was 5   Right Elbow Extension  5/5   was 5   Left Elbow Flexion  5/5   was 5   Left Elbow Extension  5/5   was 4+   Right Wrist Flexion  5/5   was 5   Right Wrist Extension  5/5   was 5   Left Wrist Flexion  5/5   was 5   Left Wrist Extension  5/5   was 5   Right Hand Grip (lbs)  60   was 65. Level 2   Left Hand Grip (lbs)  45   was 53. Level 2   Right Hip Flexion  4+/5   was 4   Right Hip ABduction  4/5   was 4-   Left Hip Flexion  4+/5   was 4   Left Hip ABduction  4/5   was 3+   Right Knee Flexion  5/5    Right Knee Extension  5/5   was 5   Left Knee Flexion  4/5    Left Knee Extension  5/5   was 4+   Right Ankle Dorsiflexion  5/5   was 4   Left Ankle Dorsiflexion  5/5   was 4     Ambulation/Gait   Ambulation Distance (Feet)  520 Feet   3MWT   Assistive device  Rolling walker    Gait Pattern  Step-through pattern;Decreased hip/knee flexion - left;Decreased hip/knee flexion - right;Decreased dorsiflexion - left;Decreased dorsiflexion - right;Trunk flexed    Gait Comments  3MWT      Static Standing Balance   Static Standing - Balance Support  No upper extremity supported    Static Standing Balance -   Activities   Single Leg Stance - Right Leg;Single Leg Stance - Left Leg    Static Standing - Comment/# of Minutes  Right 6 seconds Left 6 seconds      Standardized Balance Assessment   Standardized Balance Assessment  Five Times Sit to Stand    Five times sit to stand comments   15 seconds no UE                           PT Education - 11/05/18 1650    Education Details  Educated on re-assessment findings and updated HEP.    Person(s) Educated  Patient    Methods  Explanation;Handout    Comprehension  Verbalized understanding       PT Short Term Goals - 11/05/18 1651      PT SHORT TERM GOAL #1   Title  Pt will have improved BUE and BLE MMT by 1/2 grade throughout in order to demo improved overall strength and promote return to PLOF.    Baseline  --    Time  3    Period  Weeks    Status  Achieved    Target Date  10/14/18      PT SHORT TERM GOAL #2   Title  Pt will be able to perform 5xSTS in 10sec or < with 0 UE support in order to demo improved functional hip strength, balance, and reduce his risk for falls.    Baseline  11/05/18: Patient required 15 seconds, however demonstrated good form and no upper extremity use.    Time  3    Period  Weeks  Status  Partially Met      PT SHORT TERM GOAL #3   Title  Pt will have improved bil grip strength by 8# or > to demo improved overall function and maximize his functional use of BUE.    Baseline  11/05/18: See objective measures.    Time  3    Period  Weeks    Status  On-going        PT Long Term Goals - 11/05/18 1652      PT LONG TERM GOAL #1   Title  Pt will have improved BUE and BLE MMT by 1 grade throughout in order to further demo improved overall function and promote return to PLOF.    Baseline  11/05/18: Patient achieved in some, but not all muscle groups.    Time  6    Period  Weeks    Status  Partially Met      PT LONG TERM GOAL #2   Title  Pt will be able to ambulate 675f during 3MWT with  LRAD in order to demo improved BLE tolerance, endurance, and maximize return to PLOF.    Baseline  11/05/18: Patient demonstrated improved speed from initial evaluation, but about equal to last re-assessment.    Time  6    Period  Weeks    Status  On-going      PT LONG TERM GOAL #3   Title  Pt will be able to perform bil SLS for 6 sec or > without UE support to demo improved functional hip and ankle strength and stability in order to maximize gait and overall balance.    Time  6    Period  Weeks    Status  Achieved      PT LONG TERM GOAL #4   Title  Pt will report being able to stand for 25 mins to allow him to prepare a small meal and clean dishes with greater ease and demo improved BLE/BUE strength.    Baseline  11/05/18: 10 minutes    Time  6    Period  Weeks    Status  On-going            Plan - 11/05/18 1710    Clinical Impression Statement  Performed a re-assessment of patient's progress towards goals. Patient achieved 1 out of 3 short term goals and partially met 1 out of three short term goals. Patient achieved 1 out of 4 long term goals and partially met 1 out of 4 long term goals. Patient has improved in strength, walking endurance and speed, and with single limb balance. Based on objective findings it appeared that the areas patient improved on, he already achieved his goals, and the areas he did not achieve his goals in he demonstrated a plateau in his improvement. Feel that therapy has educated patient on exercises to continue to address remaining areas of deficits. Plan to discharge patient at this time as he has made some progress, but plateaued in his progress in other areas. In addition, patient plans to get surgery next week which may interfere with outpatient therapy. In addition, goal regarding patient's grip strength may be addressed after surgery. Educated patient on updated HEP and told him to call [place] any questions or concerns.    Personal Factors and  Comorbidities  Comorbidity 3+    Comorbidities  see above; also reports bil neuropathy    Examination-Activity Limitations  Locomotion Level;Stand    Examination-Participation Restrictions  Community Activity;[employer]  Work;Cleaning    Stability/Clinical Decision Making  Stable/Uncomplicated    Rehab Potential  Fair    PT Frequency  2x / week    PT Duration  6 weeks    PT Treatment/Interventions  ADLs/Self Care Home Management;Aquatic Therapy;Cryotherapy;Electrical Stimulation;Moist Heat;Ultrasound;DME Instruction;Gait training;Stair training;Functional mobility training;Therapeutic activities;Therapeutic exercise;Balance training;Neuromuscular re-education;Patient/family education;Orthotic Fit/Training;Manual techniques;Scar mobilization;Passive range of motion;Dry needling;Energy conservation;Taping    PT Next Visit Plan  Discharged    PT Home Exercise Plan  eval: bridging, SLR, seated GH flex and abd with RTB; 7/22: hip abd RTB, STS; 8/5: shoulder ext and rows with RTB and anchor over door, SKTC; 11/05/18: Marching x20, Heel raises 2x10    Consulted and Agree with Plan of Care  Patient       Patient will benefit from skilled therapeutic intervention in order to improve the following deficits and impairments:  Abnormal gait, Decreased activity tolerance, Decreased balance, Decreased coordination, Decreased endurance, Decreased mobility, Decreased strength, Difficulty walking, Hypomobility, Increased fascial restricitons, Impaired sensation, Improper body mechanics, Postural dysfunction, Pain  Visit Diagnosis: Muscle weakness (generalized)  Difficulty in walking, not elsewhere classified  Other symptoms and signs involving the musculoskeletal system  Other symptoms and signs involving the nervous system     Problem List Patient Active Problem List   Diagnosis Date Noted  . Acute CVA (cerebrovascular accident) ([place]) 08/05/2018  . Left leg weakness 08/05/2018  . Hyperlipidemia 08/05/2018   . Diabetes mellitus without complication ([place]) 67/70/3403  . Hypertension 08/05/2018  . Tobacco abuse 08/05/2018  . Difficulty walking 02/04/2012  . Stiffness of joints, not elsewhere classified, multiple sites 02/04/2012   Clarene Critchley PT, DPT 5:14 PM, 11/05/18 [place], [place], [address] Phone: [phone]   Fax:  [phone]  Name: THADDUS MCDOWELL MRN: [id number] Date of Birth: October 31, 1945

## 2018-11-06 NOTE — Addendum Note (Signed)
Addended by: Clarene Critchley on: 11/06/2018 10:52 AM   Modules accepted: Orders

## 2018-11-13 ENCOUNTER — Other Ambulatory Visit: Payer: Self-pay

## 2018-11-13 ENCOUNTER — Encounter (HOSPITAL_COMMUNITY): Payer: Self-pay

## 2018-11-13 ENCOUNTER — Emergency Department (HOSPITAL_COMMUNITY)
Admission: EM | Admit: 2018-11-13 | Discharge: 2018-11-13 | Disposition: A | Discharge status: Home / Self Care | Payer: No Typology Code available for payment source | Attending: Emergency Medicine | Admitting: Emergency Medicine

## 2018-11-13 DIAGNOSIS — F17210 Nicotine dependence, cigarettes, uncomplicated: Secondary | ICD-10-CM | POA: Insufficient documentation

## 2018-11-13 DIAGNOSIS — I10 Essential (primary) hypertension: Secondary | ICD-10-CM | POA: Insufficient documentation

## 2018-11-13 DIAGNOSIS — Z794 Long term (current) use of insulin: Secondary | ICD-10-CM | POA: Diagnosis not present

## 2018-11-13 DIAGNOSIS — Z79899 Other long term (current) drug therapy: Secondary | ICD-10-CM | POA: Diagnosis not present

## 2018-11-13 DIAGNOSIS — M96830 Postprocedural hemorrhage and hematoma of a musculoskeletal structure following a musculoskeletal system procedure: Secondary | ICD-10-CM | POA: Diagnosis present

## 2018-11-13 DIAGNOSIS — E119 Type 2 diabetes mellitus without complications: Secondary | ICD-10-CM | POA: Diagnosis not present

## 2018-11-13 NOTE — ED Provider Notes (Signed)
[facility] EMERGENCY DEPARTMENT Provider Note   CSN: [id number] Arrival date & time: 11/13/18  1837     History   Chief Complaint Chief Complaint  Patient presents with  . Post-op Problem    HPI Alex Salazar is a 73 y.o. male.     HPI Patient had what sounds like carpal tunnel and cubital tunnel surgery done at the [place] today.  Presents with postoperative bleeding.  States began bleeding almost immediately.  States he had his left hand unwrapped and rewrapped at the [place] before he left.  States it continues to bleed.  No lightheadedness or dizziness.  Not currently on anticoagulation but was on Plavix up until a week before the surgery.  Postop for the surgery. Past Medical History:  Diagnosis Date  . Diabetes mellitus without complication ([place])   . Hyperlipidemia   . Hypertension   . PE (pulmonary thromboembolism) [facility])     Patient Active Problem List   Diagnosis Date Noted  . Acute CVA (cerebrovascular accident) ([place]) 08/05/2018  . Left leg weakness 08/05/2018  . Hyperlipidemia 08/05/2018  . Diabetes mellitus without complication ([place]) 81/19/1478  . Hypertension 08/05/2018  . Tobacco abuse 08/05/2018  . Difficulty walking 02/04/2012  . Stiffness of joints, not elsewhere classified, multiple sites 02/04/2012    Past Surgical History:  Procedure Laterality Date  . BYPASS GRAFT          Home Medications    Prior to Admission medications   Medication Sig Start Date End Date Taking? Authorizing Provider  aspirin EC 81 MG EC tablet Take 1 tablet (81 mg total) by mouth daily with breakfast. Take for 4 weeks only, then STOP 08/06/18   Emokpae, Courage, MD  clopidogrel (PLAVIX) 75 MG tablet Take 75 mg by mouth daily.      [provider]  doxazosin (CARDURA) 4 MG tablet Take 2 mg by mouth at bedtime.     [provider]  fluticasone (FLONASE) 50 MCG/ACT nasal spray Place 1-2 sprays into both nostrils 2 (two) times daily as needed for allergies or  rhinitis.    [provider]  insulin aspart (NOVOLOG) 100 UNIT/ML injection Inject 25-35 Units into the skin 2 (two) times daily as needed for high blood sugar (Patient gives himself 25-35 units for blood sugar levels over 225).    [provider]  insulin glargine (LANTUS) 100 UNIT/ML injection Inject 25-40 Units into the skin at bedtime. If blood sugars are 180, patient takes only 25 units. Prescribed 40 units nightly    [provider]  lisinopril (ZESTRIL) 20 MG tablet Take 1 tablet (20 mg total) by mouth daily. 08/06/18   Roxan Hockey, MD  loratadine (CLARITIN) 10 MG tablet Take 10 mg by mouth daily.    [provider]  metFORMIN (GLUCOPHAGE-XR) 500 MG 24 hr tablet Take 1,000 mg by mouth 2 (two) times a day.    [provider]  nebivolol (BYSTOLIC) 5 MG tablet Take 5 mg by mouth daily.    [provider]  pravastatin (PRAVACHOL) 10 MG tablet Take 1 tablet (10 mg total) by mouth every evening. For stroke and heart attack prevention 08/06/18 08/06/19  Roxan Hockey, MD    Family History No family history on file.  Social History Social History   Tobacco Use  . Smoking status: Current Every Day Smoker    Packs/day: 0.50    Types: Cigarettes  . Smokeless tobacco: Never Used  Substance Use Topics  . Alcohol use: Not  Currently  . Drug use: Never     Allergies   Penicillins and Statins   Review of Systems Review of Systems  Constitutional: Negative for appetite change.  Respiratory: Negative for shortness of breath.   Cardiovascular: Negative for chest pain.  Gastrointestinal: Negative for abdominal pain.  Musculoskeletal: Negative for back pain.  Skin: Positive for wound.  Neurological: Negative for weakness.  Hematological: Bruises/bleeds easily.     Physical Exam Updated Vital Signs BP (!) 163/91 (BP Location: Right Arm)   Pulse 78   Temp 98.2 F (36.8 C) (Oral)   Resp 18   Ht 5\' 11"  (1.803 m)   Wt 90.7 kg    SpO2 97%   BMI 27.89 kg/m   Physical Exam Vitals signs and nursing note reviewed.  Neck:     Musculoskeletal: Neck supple.  Cardiovascular:     Rate and Rhythm: Normal rate.  Musculoskeletal:     Comments: Left upper extremity in splint.  There is dried blood over the hand along with clot.  Splint initially removed.  Wound over the right elbow medially looks good with nonadherent dressing.  However the wound over the palm and wrist continue to bleed.  Fully dressed with nonadherent dressing rewrapped and actually added a second layer of wrap with Ace and more of a direct pressure to remove it 1 to 2 days.  Sensation intact over hand.  Skin:    General: Skin is warm.     Capillary Refill: Capillary refill takes less than 2 seconds.     Coloration: Skin is not pale.  Neurological:     Mental Status: He is alert and oriented to person, place, and time.      ED Treatments / Results  Labs (all labs ordered are listed, but only abnormal results are displayed) Labs Reviewed - No data to display  EKG None  Radiology No results found.  Procedures Procedures (including critical care time)  Medications Ordered in ED Medications - No data to display   Initial Impression / Assessment and Plan / ED Course  I have reviewed the triage vital signs and the nursing notes.  Pertinent labs & imaging results that were available during my care of the patient were reviewed by me and considered in my medical decision making (see chart for details).        Patient with postoperative bleeding.  Redressed.  No bleeding while monitored in the ER.  Follow-up with surgeon.  Discharge home.  Will remove second layer of the dressing over the hand in 1 to 2 days.  Final Clinical Impressions(s) / ED Diagnoses   Final diagnoses:  Postoperative hemorrhage of musculoskeletal structure following musculoskeletal procedure    ED Discharge Orders    None       Benjiman CorePickering, Graceson Nichelson, MD 11/13/18  2134

## 2018-11-13 NOTE — Discharge Instructions (Signed)
Follow-up with your surgeon.  Remove the second later of the dressing over the hand in 1 to 2 days.

## 2018-11-13 NOTE — ED Notes (Signed)
Unwrapped patient's splint with Dr Alvino Chapel at bedside. Small amount of bleeding noted to surgical site at left wrist. Cleaned patient's hand and applied sterile gauze with slight pressure to area for approximately 5 minutes. Bleeding stopped. Covered surgical site with tefla and sterile gauze and re-wrapped applying same splint to left arm. Capillary refill < 3 seconds. Patient able to move fingers on left hand.

## 2018-11-13 NOTE — ED Triage Notes (Addendum)
Pt had carpel tunnel surgery today on left side today at [facility]. Pt states he started bleeding around 1600 today. Pt states he stopped his blood thinners last week. Splint noted on left side with blood on palm side of hand, no dripping of blood noted at this time.

## 2019-03-23 ENCOUNTER — Emergency Department (HOSPITAL_COMMUNITY)
Admission: EM | Admit: 2019-03-23 | Discharge: 2019-03-23 | Disposition: A | Discharge status: Home / Self Care | Payer: No Typology Code available for payment source | Attending: Emergency Medicine | Admitting: Emergency Medicine

## 2019-03-23 ENCOUNTER — Other Ambulatory Visit: Payer: Self-pay

## 2019-03-23 ENCOUNTER — Emergency Department (HOSPITAL_COMMUNITY): Payer: No Typology Code available for payment source

## 2019-03-23 DIAGNOSIS — M25551 Pain in right hip: Secondary | ICD-10-CM | POA: Insufficient documentation

## 2019-03-23 DIAGNOSIS — E119 Type 2 diabetes mellitus without complications: Secondary | ICD-10-CM | POA: Insufficient documentation

## 2019-03-23 DIAGNOSIS — Y929 Unspecified place or not applicable: Secondary | ICD-10-CM | POA: Diagnosis not present

## 2019-03-23 DIAGNOSIS — M25561 Pain in right knee: Secondary | ICD-10-CM | POA: Diagnosis not present

## 2019-03-23 DIAGNOSIS — Z79899 Other long term (current) drug therapy: Secondary | ICD-10-CM | POA: Insufficient documentation

## 2019-03-23 DIAGNOSIS — Z7901 Long term (current) use of anticoagulants: Secondary | ICD-10-CM | POA: Insufficient documentation

## 2019-03-23 DIAGNOSIS — Y999 Unspecified external cause status: Secondary | ICD-10-CM | POA: Diagnosis not present

## 2019-03-23 DIAGNOSIS — F17210 Nicotine dependence, cigarettes, uncomplicated: Secondary | ICD-10-CM | POA: Insufficient documentation

## 2019-03-23 DIAGNOSIS — I10 Essential (primary) hypertension: Secondary | ICD-10-CM | POA: Insufficient documentation

## 2019-03-23 DIAGNOSIS — W010XXA Fall on same level from slipping, tripping and stumbling without subsequent striking against object, initial encounter: Secondary | ICD-10-CM | POA: Diagnosis not present

## 2019-03-23 DIAGNOSIS — Z794 Long term (current) use of insulin: Secondary | ICD-10-CM | POA: Insufficient documentation

## 2019-03-23 DIAGNOSIS — Z7982 Long term (current) use of aspirin: Secondary | ICD-10-CM | POA: Insufficient documentation

## 2019-03-23 DIAGNOSIS — W19XXXA Unspecified fall, initial encounter: Secondary | ICD-10-CM

## 2019-03-23 DIAGNOSIS — Y939 Activity, unspecified: Secondary | ICD-10-CM | POA: Insufficient documentation

## 2019-03-23 NOTE — ED Triage Notes (Addendum)
Pt reports bent over to pick up something off of the floor on Saturday. Pt reports when he bent over "I kept on going." pt reports right hip/knee pain. Pt denies loc, hitting head, dizziness at time of fall. Pt able to bear weight and ambulate with cane. Pt reports is on plavix.

## 2019-03-23 NOTE — Discharge Instructions (Signed)
You can take 1 to 2 tablets of Tylenol every 6 hours as needed for pain.  Can apply ice or heat as needed for comfort.  Do some gentle stretching.  Follow-up with orthopedist for reevaluation of symptoms.  Return to the emergency department if any concerning signs or symptoms develop.

## 2019-03-23 NOTE — ED Provider Notes (Signed)
[facility] EMERGENCY DEPARTMENT Provider Note   CSN: [id number] Arrival date & time: 03/23/19  1224     History Chief Complaint  Patient presents with  . Hip Pain    Alex Salazar is a 74 y.o. male with history of diabetes mellitus, hyperlipidemia, hypertension, PE currently on Plavix presents for evaluation of acute onset, intermittent right hip and knee pain secondary to mechanical fall 4 days ago.  He states that on Saturday he bent down to pick something up off the floor when he lost his balance and landed on his right side.  Denies head injury or loss of consciousness.  Since then has had intermittent throbbing aching pain from the right hip radiating down to the right knee.  Denies numbness or weakness of the extremity.  He does report chronic imbalance issues due to peripheral neuropathy and typically ambulates with the aid of a cane.  Denies low back pain, nausea, vomiting, headaches, vision changes.  He only really feels pain with position changes.  He continues to be able to ambulate with the aid of a cane and is able to bear weight without difficulty.  The history is provided by the patient.       Past Medical History:  Diagnosis Date  . Diabetes mellitus without complication ([place])   . Hyperlipidemia   . Hypertension   . PE (pulmonary thromboembolism) [facility])     Patient Active Problem List   Diagnosis Date Noted  . Acute CVA (cerebrovascular accident) ([place]) 08/05/2018  . Left leg weakness 08/05/2018  . Hyperlipidemia 08/05/2018  . Diabetes mellitus without complication ([place]) 85/46/2703  . Hypertension 08/05/2018  . Tobacco abuse 08/05/2018  . Difficulty walking 02/04/2012  . Stiffness of joints, not elsewhere classified, multiple sites 02/04/2012    Past Surgical History:  Procedure Laterality Date  . BYPASS GRAFT         No family history on file.  Social History   Tobacco Use  . Smoking status: Current Every Day Smoker    Packs/day: 0.50   Types: Cigarettes  . Smokeless tobacco: Never Used  Substance Use Topics  . Alcohol use: Not Currently  . Drug use: Never    Home Medications Prior to Admission medications   Medication Sig Start Date End Date Taking? Authorizing Provider  aspirin EC 81 MG EC tablet Take 1 tablet (81 mg total) by mouth daily with breakfast. Take for 4 weeks only, then STOP 08/06/18   Emokpae, Courage, MD  clopidogrel (PLAVIX) 75 MG tablet Take 75 mg by mouth daily.      [provider]  doxazosin (CARDURA) 4 MG tablet Take 2 mg by mouth at bedtime.     [provider]  fluticasone (FLONASE) 50 MCG/ACT nasal spray Place 1-2 sprays into both nostrils 2 (two) times daily as needed for allergies or rhinitis.    [provider]  insulin aspart (NOVOLOG) 100 UNIT/ML injection Inject 25-35 Units into the skin 2 (two) times daily as needed for high blood sugar (Patient gives himself 25-35 units for blood sugar levels over 225).    [provider]  insulin glargine (LANTUS) 100 UNIT/ML injection Inject 25-40 Units into the skin at bedtime. If blood sugars are 180, patient takes only 25 units. Prescribed 40 units nightly    [provider]  lisinopril (ZESTRIL) 20 MG tablet Take 1 tablet (20 mg total) by mouth daily. 08/06/18   Roxan Hockey, MD  loratadine (CLARITIN) 10 MG tablet Take 10 mg by  mouth daily.    [provider]  metFORMIN (GLUCOPHAGE-XR) 500 MG 24 hr tablet Take 1,000 mg by mouth 2 (two) times a day.    [provider]  nebivolol (BYSTOLIC) 5 MG tablet Take 5 mg by mouth daily.    [provider]  pravastatin (PRAVACHOL) 10 MG tablet Take 1 tablet (10 mg total) by mouth every evening. For stroke and heart attack prevention 08/06/18 08/06/19  Shon Hale, MD    Allergies    Penicillins and Statins  Review of Systems   Review of Systems  Constitutional: Negative for chills and fever.  Musculoskeletal: Positive for arthralgias.   Neurological: Negative for syncope, numbness and headaches.  All other systems reviewed and are negative.   Physical Exam Updated Vital Signs BP (!) 159/78 (BP Location: Right Arm)   Pulse 88   Temp 97.7 F (36.5 C) (Oral)   Resp 18   Ht 5\' 11"  (1.803 m)   Wt 81.6 kg   SpO2 100%   BMI 25.10 kg/m   Physical Exam Vitals and nursing note reviewed.  Constitutional:      General: He is not in acute distress.    Appearance: He is well-developed.  HENT:     Head: Normocephalic and atraumatic.  Eyes:     General:        Right eye: No discharge.        Left eye: No discharge.     Conjunctiva/sclera: Conjunctivae normal.  Neck:     Vascular: No JVD.     Trachea: No tracheal deviation.     Comments: No midline cervical spine tenderness Cardiovascular:     Rate and Rhythm: Normal rate and regular rhythm.  Pulmonary:     Effort: Pulmonary effort is normal.     Breath sounds: Normal breath sounds.  Abdominal:     General: Bowel sounds are normal. There is no distension.     Palpations: Abdomen is soft.     Tenderness: There is no abdominal tenderness. There is no guarding.  Musculoskeletal:        General: Tenderness present.     Cervical back: Neck supple. No rigidity.     Comments: Mild soreness to palpation of the lateral aspect of the right hip.  Ecchymosis noted, appears to be healing.  No tenderness to palpation of the right knee, no varus or valgus instability, no ligamentous laxity.  Negative anterior/posterior drawer test.  5/5 strength of BLE major muscle groups.  No midline lumbar spine tenderness.  Pelvis is stable on palpation.  Normal passive range of motion of the right hip with no pain noted.  Skin:    General: Skin is warm and dry.     Findings: Bruising present. No erythema.  Neurological:     Mental Status: He is alert and oriented to person, place, and time.     Comments: Fluent speech, no facial droop, sensation intact to light touch of bilateral lower  extremities.  Patient ambulatory with steady gait and balance, uses the aid of a cane.  Psychiatric:        Behavior: Behavior normal.     ED Results / Procedures / Treatments   Labs (all labs ordered are listed, but only abnormal results are displayed) Labs Reviewed - No data to display  EKG None  Radiology  CLINICAL DATA:  Fall. Hip pain. Tenderness over the lateral aspect of the joint.  EXAM: DG HIP (WITH OR WITHOUT PELVIS) 2-3V RIGHT  COMPARISON:  None.  FINDINGS: The right hip is located. Moderate degenerative changes are present. No acute or healing fractures are present. Pelvis is intact. Left total hip arthroplasty is noted.  IMPRESSION: 1. No acute or healing fracture. 2. 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   Electronically Signed   By: Marin Roberts M.D.   On: 03/23/2019 13:31  CLINICAL DATA:  Right knee pain and tenderness since the patient suffered a fall 03/19/2018. Initial encounter.  EXAM: RIGHT KNEE - COMPLETE 4+ VIEW  COMPARISON:  None.  FINDINGS: No evidence of fracture, dislocation, or joint effusion. No evidence of arthropathy or other focal bone abnormality. Soft tissues are unremarkable.  IMPRESSION: Negative exam.   Electronically Signed   By: Drusilla Kanner M.D.   On: 03/23/2019 13:29  Procedures Procedures (including critical care time)  Medications Ordered in ED Medications - No data to display  ED Course  I have reviewed the triage vital signs and the nursing notes.  Pertinent labs & imaging results that were available during my care of the patient were reviewed by me and considered in my medical decision making (see chart for details).    MDM Rules/Calculators/A&P                      Patient presenting for evaluation of right hip pain radiating to the right knee secondary to mechanical fall a few days ago.  He has a history of balance issues due to peripheral neuropathy.  He is  afebrile, vital signs are stable.  He is nontoxic in appearance.  He is neurovascularly intact.  He has no midline lumbar spine tenderness and no red flag signs concerning for cauda equina or spinal abscess.  He is ambulatory with the aid of a cane which is his baseline.  Compartments are soft.  No signs of secondary skin infection.  Imaging shows no acute osseous abnormalities but some degenerative changes noted.  He has some ecchymosis around the hip and is on blood thinners.  No head injury and I doubt ICH, SAH, skull fracture given no signs of trauma to this area and he is several days out from his injury with no neurologic findings on examination today.  Discussed conservative therapy management with Tylenol, gentle stretching, recommend follow-up with PCP or orthopedist for reevaluation of symptoms.  Discussed strict ED return precautions. Pt verbalized understanding of and agreement with plan and is safe for discharge home at this time.  Final Clinical Impression(s) / ED Diagnoses Final diagnoses:  Fall, initial encounter  Acute right hip pain  Acute pain of right knee    Rx / DC Orders ED Discharge Orders    None       Bennye Alm 03/26/19 1555    Donnetta Hutching, MD 03/26/19 [phone]

## 2020-08-21 IMAGING — DX DG KNEE COMPLETE 4+V*R*
4 series · 4 of 4 positions shown · non-contrast
Comparison: None.

CLINICAL DATA: Right knee pain and tenderness since the patient
suffered a fall 03/19/2018. Initial encounter.

EXAM:
RIGHT KNEE - COMPLETE 4+ VIEW

[knee ap]
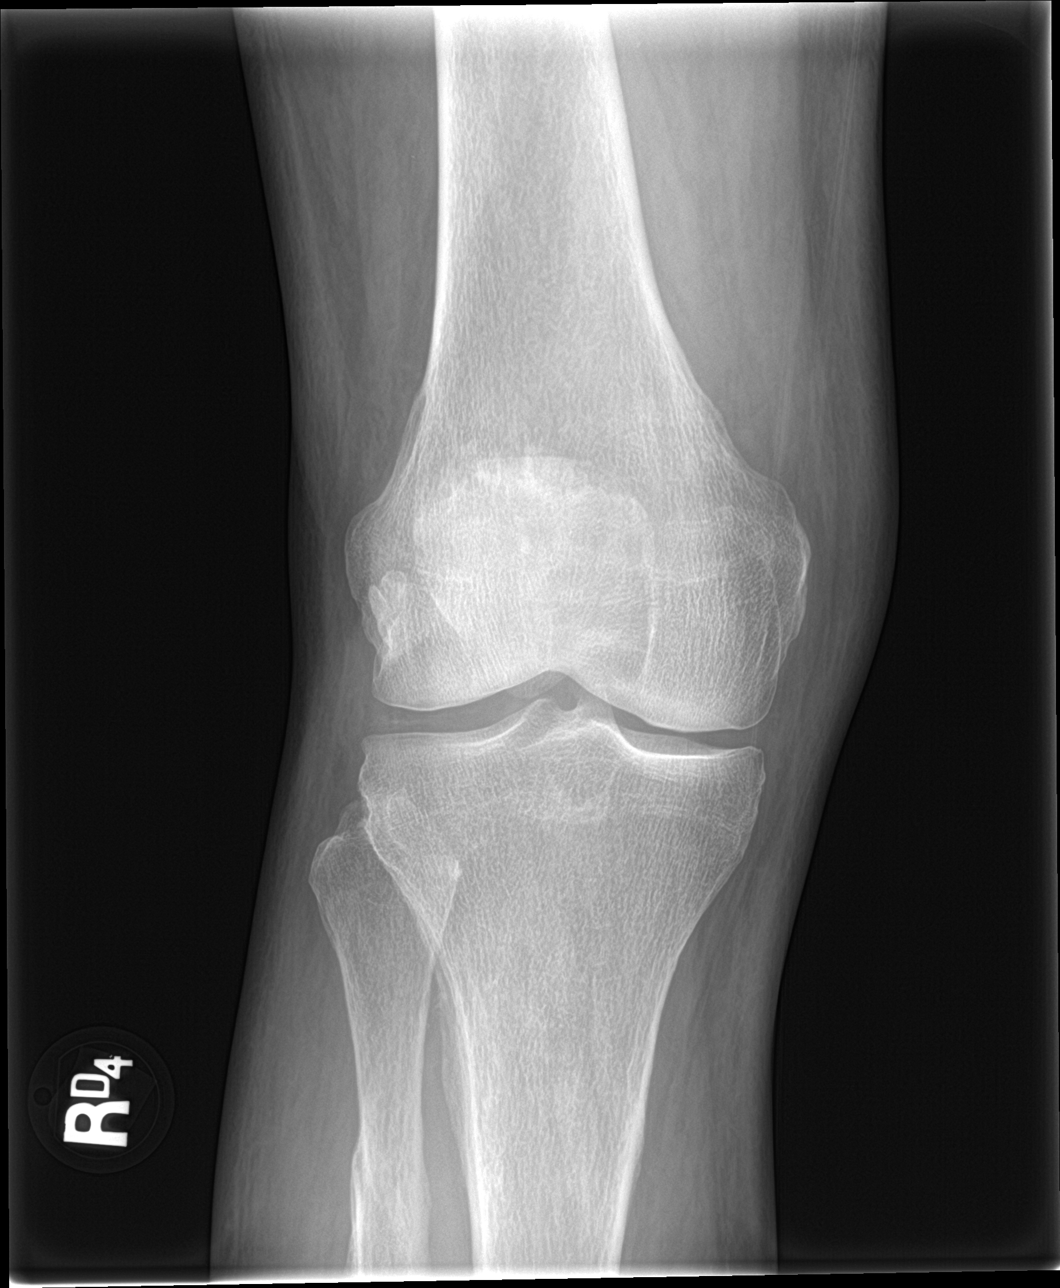

[knee obl (1 of 2)]
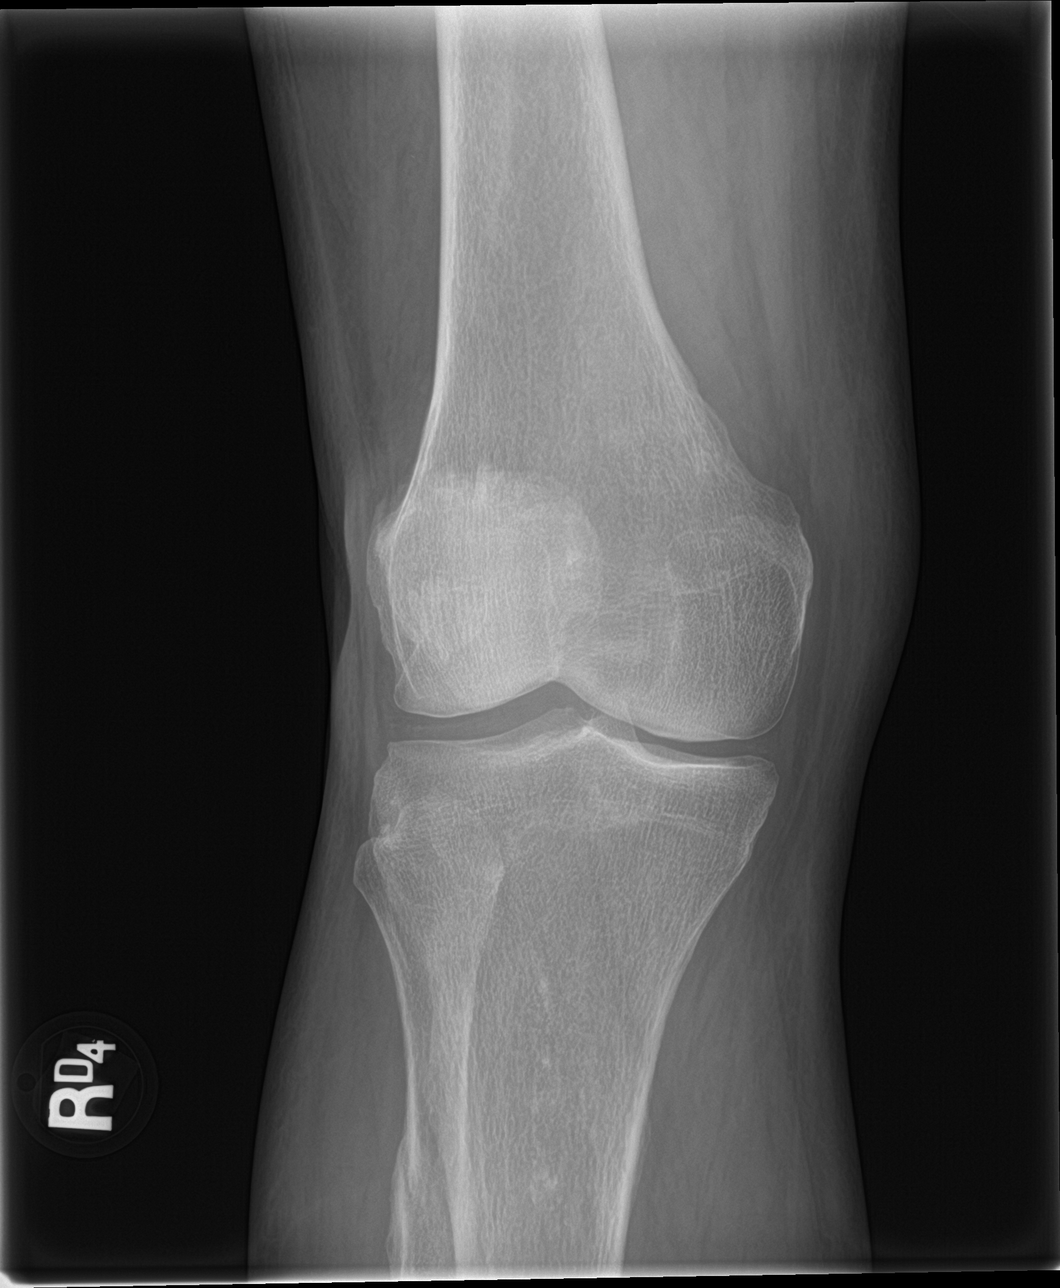

[knee obl (2 of 2)]
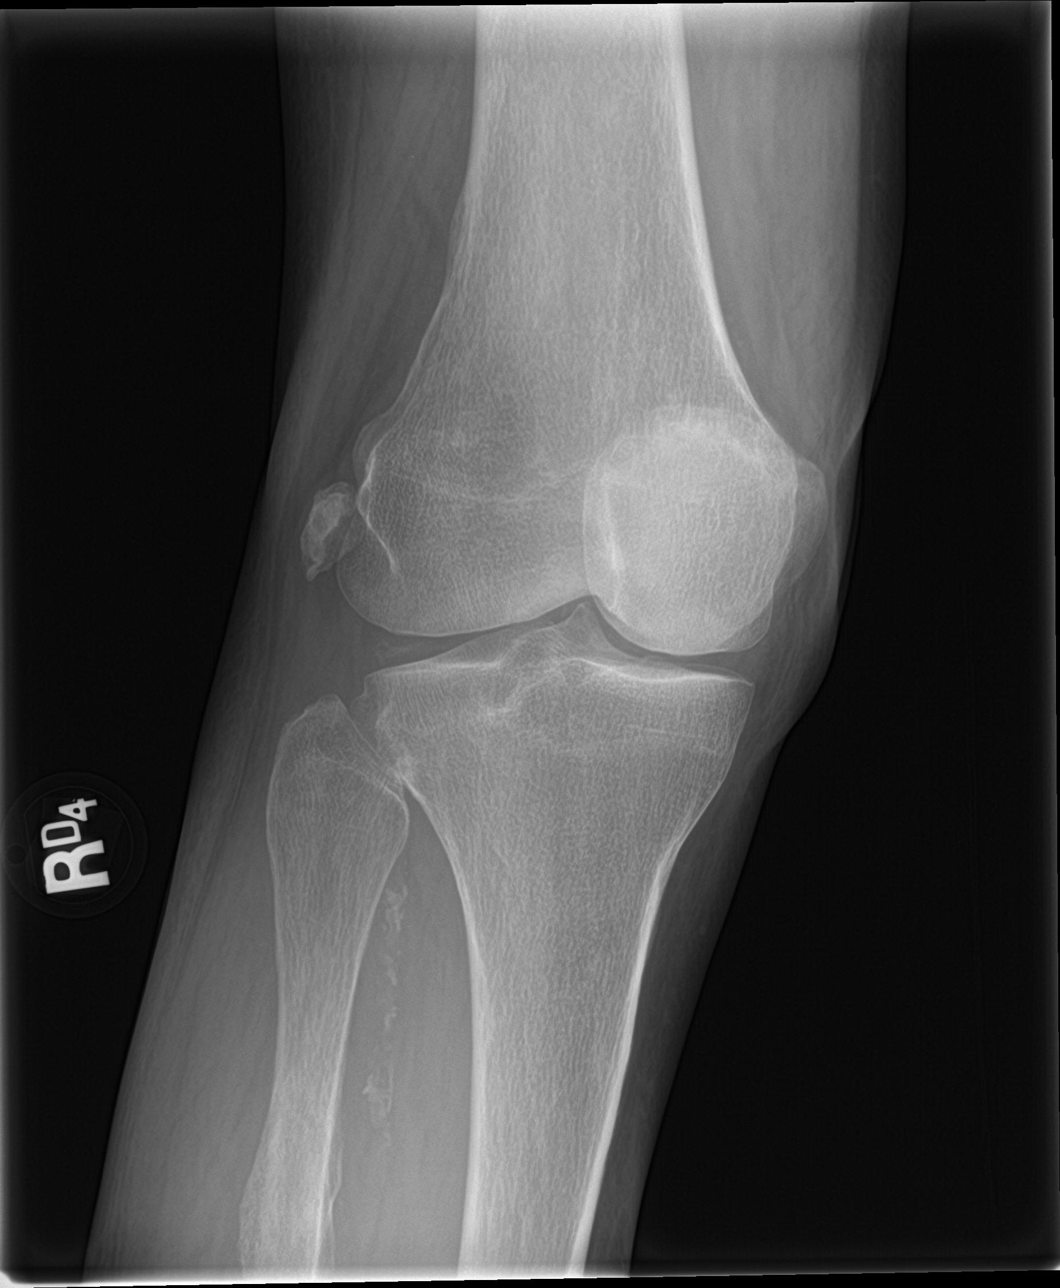

[knee lat]
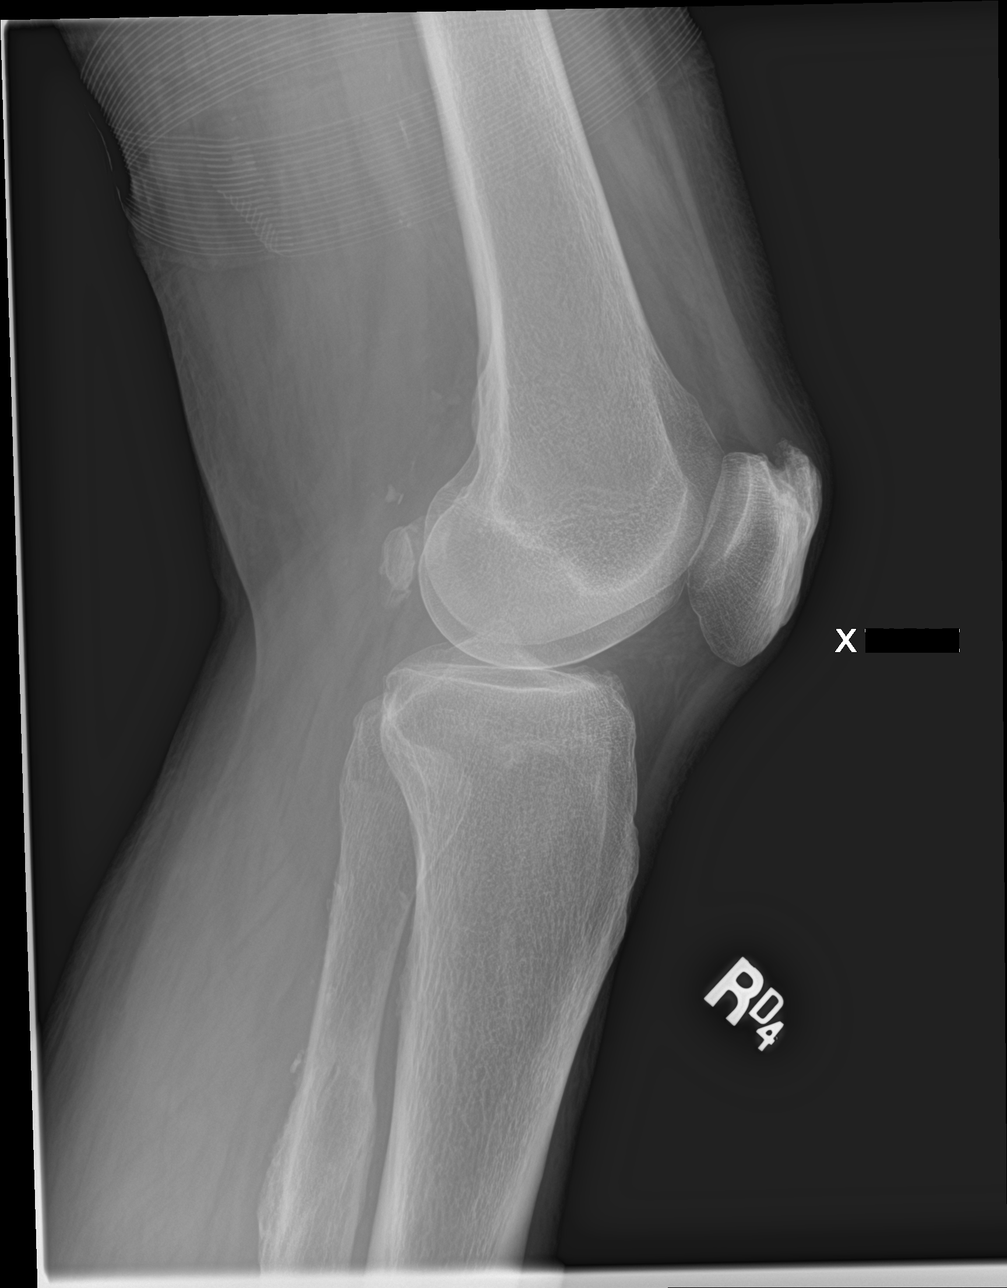

[4 of 4 positions shown; findings below may reference images not displayed]

FINDINGS: No evidence of fracture, dislocation, or joint effusion. No evidence
of arthropathy or other focal bone abnormality. Soft tissues are
unremarkable.
IMPRESSION: Negative exam.

## 2022-08-14 ENCOUNTER — Inpatient Hospital Stay (HOSPITAL_COMMUNITY)
Admission: EM | Admit: 2022-08-14 | Discharge: 2022-08-17 | DRG: 683 | Disposition: A | Discharge status: Home Health | Payer: Medicare PPO | Attending: Internal Medicine | Admitting: Internal Medicine

## 2022-08-14 ENCOUNTER — Emergency Department (HOSPITAL_COMMUNITY): Payer: Medicare PPO

## 2022-08-14 ENCOUNTER — Encounter (HOSPITAL_COMMUNITY): Payer: Self-pay | Admitting: Emergency Medicine

## 2022-08-14 ENCOUNTER — Other Ambulatory Visit: Payer: Self-pay

## 2022-08-14 DIAGNOSIS — Z7984 Long term (current) use of oral hypoglycemic drugs: Secondary | ICD-10-CM

## 2022-08-14 DIAGNOSIS — E872 Acidosis, unspecified: Secondary | ICD-10-CM | POA: Insufficient documentation

## 2022-08-14 DIAGNOSIS — Z880 Allergy status to penicillin: Secondary | ICD-10-CM

## 2022-08-14 DIAGNOSIS — F17210 Nicotine dependence, cigarettes, uncomplicated: Secondary | ICD-10-CM | POA: Diagnosis present

## 2022-08-14 DIAGNOSIS — E11649 Type 2 diabetes mellitus with hypoglycemia without coma: Secondary | ICD-10-CM | POA: Insufficient documentation

## 2022-08-14 DIAGNOSIS — Z86711 Personal history of pulmonary embolism: Secondary | ICD-10-CM

## 2022-08-14 DIAGNOSIS — E785 Hyperlipidemia, unspecified: Secondary | ICD-10-CM | POA: Diagnosis present

## 2022-08-14 DIAGNOSIS — R7989 Other specified abnormal findings of blood chemistry: Secondary | ICD-10-CM | POA: Insufficient documentation

## 2022-08-14 DIAGNOSIS — M6282 Rhabdomyolysis: Secondary | ICD-10-CM | POA: Diagnosis present

## 2022-08-14 DIAGNOSIS — Z7902 Long term (current) use of antithrombotics/antiplatelets: Secondary | ICD-10-CM

## 2022-08-14 DIAGNOSIS — R531 Weakness: Secondary | ICD-10-CM

## 2022-08-14 DIAGNOSIS — R296 Repeated falls: Secondary | ICD-10-CM | POA: Diagnosis present

## 2022-08-14 DIAGNOSIS — E46 Unspecified protein-calorie malnutrition: Secondary | ICD-10-CM | POA: Insufficient documentation

## 2022-08-14 DIAGNOSIS — W19XXXA Unspecified fall, initial encounter: Secondary | ICD-10-CM

## 2022-08-14 DIAGNOSIS — E441 Mild protein-calorie malnutrition: Secondary | ICD-10-CM | POA: Diagnosis present

## 2022-08-14 DIAGNOSIS — E860 Dehydration: Secondary | ICD-10-CM | POA: Insufficient documentation

## 2022-08-14 DIAGNOSIS — Z79899 Other long term (current) drug therapy: Secondary | ICD-10-CM

## 2022-08-14 DIAGNOSIS — W010XXA Fall on same level from slipping, tripping and stumbling without subsequent striking against object, initial encounter: Secondary | ICD-10-CM | POA: Diagnosis present

## 2022-08-14 DIAGNOSIS — E1165 Type 2 diabetes mellitus with hyperglycemia: Secondary | ICD-10-CM | POA: Diagnosis present

## 2022-08-14 DIAGNOSIS — I10 Essential (primary) hypertension: Secondary | ICD-10-CM | POA: Diagnosis present

## 2022-08-14 DIAGNOSIS — Y92009 Unspecified place in unspecified non-institutional (private) residence as the place of occurrence of the external cause: Secondary | ICD-10-CM

## 2022-08-14 DIAGNOSIS — Z951 Presence of aortocoronary bypass graft: Secondary | ICD-10-CM

## 2022-08-14 DIAGNOSIS — Z7982 Long term (current) use of aspirin: Secondary | ICD-10-CM

## 2022-08-14 DIAGNOSIS — Z888 Allergy status to other drugs, medicaments and biological substances status: Secondary | ICD-10-CM

## 2022-08-14 DIAGNOSIS — E8809 Other disorders of plasma-protein metabolism, not elsewhere classified: Secondary | ICD-10-CM | POA: Diagnosis present

## 2022-08-14 DIAGNOSIS — D72829 Elevated white blood cell count, unspecified: Secondary | ICD-10-CM | POA: Insufficient documentation

## 2022-08-14 DIAGNOSIS — N179 Acute kidney failure, unspecified: Principal | ICD-10-CM | POA: Insufficient documentation

## 2022-08-14 DIAGNOSIS — Z6825 Body mass index (BMI) 25.0-25.9, adult: Secondary | ICD-10-CM

## 2022-08-14 DIAGNOSIS — Z794 Long term (current) use of insulin: Secondary | ICD-10-CM

## 2022-08-14 DIAGNOSIS — Z8673 Personal history of transient ischemic attack (TIA), and cerebral infarction without residual deficits: Secondary | ICD-10-CM

## 2022-08-14 DIAGNOSIS — M47815 Spondylosis without myelopathy or radiculopathy, thoracolumbar region: Secondary | ICD-10-CM | POA: Diagnosis present

## 2022-08-14 DIAGNOSIS — I2510 Atherosclerotic heart disease of native coronary artery without angina pectoris: Secondary | ICD-10-CM | POA: Insufficient documentation

## 2022-08-14 LAB — CBC WITH DIFFERENTIAL/PLATELET
Abs Immature Granulocytes: 0.08 10*3/uL — ABNORMAL HIGH (ref 0.00–0.07)
Basophils Absolute: 0.1 10*3/uL (ref 0.0–0.1)
Basophils Relative: 1 %
Eosinophils Absolute: 0.3 10*3/uL (ref 0.0–0.5)
Eosinophils Relative: 2 %
HCT: 46.7 % (ref 39.0–52.0)
Hemoglobin: 15.6 g/dL (ref 13.0–17.0)
Immature Granulocytes: 1 %
Lymphocytes Relative: 19 %
Lymphs Abs: 2.7 10*3/uL (ref 0.7–4.0)
MCH: 30.1 pg (ref 26.0–34.0)
MCHC: 33.4 g/dL (ref 30.0–36.0)
MCV: 90 fL (ref 80.0–100.0)
Monocytes Absolute: 1.4 10*3/uL — ABNORMAL HIGH (ref 0.1–1.0)
Monocytes Relative: 10 %
Neutro Abs: 9.6 10*3/uL — ABNORMAL HIGH (ref 1.7–7.7)
Neutrophils Relative %: 67 %
Platelets: 229 10*3/uL (ref 150–400)
RBC: 5.19 MIL/uL (ref 4.22–5.81)
RDW: 14.3 % (ref 11.5–15.5)
WBC: 14.1 10*3/uL — ABNORMAL HIGH (ref 4.0–10.5)
nRBC: 0 % (ref 0.0–0.2)

## 2022-08-14 MED ORDER — SODIUM CHLORIDE 0.9 % IV BOLUS
1000.0000 mL | Freq: Once | INTRAVENOUS | Status: AC
  Administered 2022-08-14: 1000 mL via INTRAVENOUS

## 2022-08-14 NOTE — ED Triage Notes (Signed)
Pt c/o overall weakness and 2 falls over last 3 days. Pt c/o mid back pain.

## 2022-08-14 NOTE — ED Provider Notes (Signed)
[place] EMERGENCY DEPARTMENT AT [facility] Provider Note   CSN: [id number] Arrival date & time: 08/14/22  2232     History {Add pertinent medical, surgical, social history, OB history to HPI:1} Chief Complaint  Patient presents with  . Fall    Alex Salazar is a 77 y.o. male.  Patient presents to the emergency department for evaluation after a fall.  Patient has been feeling very weak for the last 3 days.  He has had a slight global headache.  He has fallen twice.  Tonight he lost his balance because his legs gave out and he fell onto his back.  He did hit his head but no loss of consciousness.  Patient complaining of mid back pain which is not chronic for him.      Home Medications Prior to Admission medications   Medication Sig Start Date End Date Taking? Authorizing Provider  aspirin EC 81 MG EC tablet Take 1 tablet (81 mg total) by mouth daily with breakfast. Take for 4 weeks only, then STOP 08/06/18   Emokpae, Courage, MD  clopidogrel (PLAVIX) 75 MG tablet Take 75 mg by mouth daily.      [provider]  doxazosin (CARDURA) 4 MG tablet Take 2 mg by mouth at bedtime.     [provider]  fluticasone (FLONASE) 50 MCG/ACT nasal spray Place 1-2 sprays into both nostrils 2 (two) times daily as needed for allergies or rhinitis.    [provider]  insulin aspart (NOVOLOG) 100 UNIT/ML injection Inject 25-35 Units into the skin 2 (two) times daily as needed for high blood sugar (Patient gives himself 25-35 units for blood sugar levels over 225).    [provider]  insulin glargine (LANTUS) 100 UNIT/ML injection Inject 25-40 Units into the skin at bedtime. If blood sugars are 180, patient takes only 25 units. Prescribed 40 units nightly    [provider]  lisinopril (ZESTRIL) 20 MG tablet Take 1 tablet (20 mg total) by mouth daily. 08/06/18   Shon Hale, MD  loratadine (CLARITIN) 10 MG tablet Take 10 mg by mouth daily.     [provider]  metFORMIN (GLUCOPHAGE-XR) 500 MG 24 hr tablet Take 1,000 mg by mouth 2 (two) times a day.    [provider]  nebivolol (BYSTOLIC) 5 MG tablet Take 5 mg by mouth daily.    [provider]  pravastatin (PRAVACHOL) 10 MG tablet Take 1 tablet (10 mg total) by mouth every evening. For stroke and heart attack prevention 08/06/18 08/06/19  Shon Hale, MD      Allergies    Penicillins and Statins    Review of Systems   Review of Systems  Physical Exam Updated Vital Signs BP (!) 92/59   Pulse 78   Resp 18   Ht 5\' 11"  (1.803 m)   Wt 82 kg   SpO2 96%   BMI 25.21 kg/m  Physical Exam Vitals and nursing note reviewed.  Constitutional:      General: He is not in acute distress.    Appearance: He is well-developed.  HENT:     Head: Normocephalic and atraumatic.     Mouth/Throat:     Mouth: Mucous membranes are moist.  Eyes:     General: Vision grossly intact. Gaze aligned appropriately.     Extraocular Movements: Extraocular movements intact.     Conjunctiva/sclera: Conjunctivae normal.  Cardiovascular:     Rate and Rhythm: Normal rate and regular rhythm.  Pulses: Normal pulses.     Heart sounds: Normal heart sounds, S1 normal and S2 normal. No murmur heard.    No friction rub. No gallop.  Pulmonary:     Effort: Pulmonary effort is normal. No respiratory distress.     Breath sounds: Normal breath sounds.  Abdominal:     Palpations: Abdomen is soft.     Tenderness: There is no abdominal tenderness. There is no guarding or rebound.     Hernia: No hernia is present.  Musculoskeletal:        General: No swelling.     Cervical back: Full passive range of motion without pain, normal range of motion and neck supple. No pain with movement, spinous process tenderness or muscular tenderness. Normal range of motion.       Back:     Right lower leg: No edema.     Left lower leg: No edema.  Skin:    General: Skin is warm and dry.      Capillary Refill: Capillary refill takes less than 2 seconds.     Findings: No ecchymosis, erythema, lesion or wound.  Neurological:     Mental Status: He is alert and oriented to person, place, and time.     GCS: GCS eye subscore is 4. GCS verbal subscore is 5. GCS motor subscore is 6.     Cranial Nerves: Cranial nerves 2-12 are intact.     Sensory: Sensation is intact.     Motor: Motor function is intact. No weakness or abnormal muscle tone.     Coordination: Coordination is intact.  Psychiatric:        Mood and Affect: Mood normal.        Speech: Speech normal.        Behavior: Behavior normal.    ED Results / Procedures / Treatments   Labs (all labs ordered are listed, but only abnormal results are displayed) Labs Reviewed - No data to display  EKG None  Radiology No results found.  Procedures Procedures  {Document cardiac monitor, telemetry assessment procedure when appropriate:1}  Medications Ordered in ED Medications  sodium chloride 0.9 % bolus 1,000 mL (has no administration in time range)    ED Course/ Medical Decision Making/ A&P   {   Click here for ABCD2, HEART and other calculatorsREFRESH Note before signing :1}                          Medical Decision Making Amount and/or Complexity of Data Reviewed Labs: ordered. Radiology: ordered.   ***  {Document critical care time when appropriate:1} {Document review of labs and clinical decision tools ie heart score, Chads2Vasc2 etc:1}  {Document your independent review of radiology images, and any outside records:1} {Document your discussion with family members, caretakers, and with consultants:1} {Document social determinants of health affecting pt's care:1} {Document your decision making why or why not admission, treatments were needed:1} Final Clinical Impression(s) / ED Diagnoses Final diagnoses:  None    Rx / DC Orders ED Discharge Orders     None

## 2022-08-15 ENCOUNTER — Observation Stay (HOSPITAL_COMMUNITY): Payer: Medicare PPO

## 2022-08-15 ENCOUNTER — Emergency Department (HOSPITAL_COMMUNITY): Payer: Medicare PPO

## 2022-08-15 DIAGNOSIS — E872 Acidosis, unspecified: Secondary | ICD-10-CM

## 2022-08-15 DIAGNOSIS — E11649 Type 2 diabetes mellitus with hypoglycemia without coma: Secondary | ICD-10-CM | POA: Insufficient documentation

## 2022-08-15 DIAGNOSIS — R7989 Other specified abnormal findings of blood chemistry: Secondary | ICD-10-CM | POA: Diagnosis not present

## 2022-08-15 DIAGNOSIS — I2510 Atherosclerotic heart disease of native coronary artery without angina pectoris: Secondary | ICD-10-CM

## 2022-08-15 DIAGNOSIS — N179 Acute kidney failure, unspecified: Secondary | ICD-10-CM

## 2022-08-15 DIAGNOSIS — Z794 Long term (current) use of insulin: Secondary | ICD-10-CM

## 2022-08-15 DIAGNOSIS — W19XXXD Unspecified fall, subsequent encounter: Secondary | ICD-10-CM

## 2022-08-15 DIAGNOSIS — E8809 Other disorders of plasma-protein metabolism, not elsewhere classified: Secondary | ICD-10-CM

## 2022-08-15 DIAGNOSIS — D72829 Elevated white blood cell count, unspecified: Secondary | ICD-10-CM | POA: Insufficient documentation

## 2022-08-15 DIAGNOSIS — I2489 Other forms of acute ischemic heart disease: Secondary | ICD-10-CM | POA: Diagnosis not present

## 2022-08-15 DIAGNOSIS — W19XXXA Unspecified fall, initial encounter: Secondary | ICD-10-CM | POA: Diagnosis not present

## 2022-08-15 DIAGNOSIS — E46 Unspecified protein-calorie malnutrition: Secondary | ICD-10-CM

## 2022-08-15 DIAGNOSIS — Y92009 Unspecified place in unspecified non-institutional (private) residence as the place of occurrence of the external cause: Secondary | ICD-10-CM

## 2022-08-15 DIAGNOSIS — Z8673 Personal history of transient ischemic attack (TIA), and cerebral infarction without residual deficits: Secondary | ICD-10-CM

## 2022-08-15 DIAGNOSIS — E860 Dehydration: Secondary | ICD-10-CM | POA: Insufficient documentation

## 2022-08-15 LAB — LACTIC ACID, PLASMA
Lactic Acid, Venous: 2.7 mmol/L (ref 0.5–1.9)
Lactic Acid, Venous: 1.9 mmol/L (ref 0.5–1.9)
Lactic Acid, Venous: 2.4 mmol/L (ref 0.5–1.9)

## 2022-08-15 LAB — TROPONIN I (HIGH SENSITIVITY)
Troponin I (High Sensitivity): 400 ng/L (ref <18)
Troponin I (High Sensitivity): 404 ng/L (ref <18)
Troponin I (High Sensitivity): 275 ng/L (ref <18)
Troponin I (High Sensitivity): 317 ng/L (ref <18)

## 2022-08-15 LAB — COMPREHENSIVE METABOLIC PANEL
ALT: 19 U/L (ref 0–44)
AST: 23 U/L (ref 15–41)
Albumin: 3.4 g/dL — ABNORMAL LOW (ref 3.5–5.0)
Alkaline Phosphatase: 72 U/L (ref 38–126)
Anion gap: 12 (ref 5–15)
BUN: 20 mg/dL (ref 8–23)
CO2: 26 mmol/L (ref 22–32)
Calcium: 9.6 mg/dL (ref 8.9–10.3)
Chloride: 99 mmol/L (ref 98–111)
Creatinine, Ser: 1.74 mg/dL — ABNORMAL HIGH (ref 0.61–1.24)
GFR, Estimated: 40 mL/min — ABNORMAL LOW (ref >60)
Glucose, Bld: 63 mg/dL — ABNORMAL LOW (ref 70–99)
Potassium: 3.6 mmol/L (ref 3.5–5.1)
Sodium: 137 mmol/L (ref 135–145)
Total Bilirubin: 0.6 mg/dL (ref 0.3–1.2)
Total Protein: 8.3 g/dL — ABNORMAL HIGH (ref 6.5–8.1)

## 2022-08-15 LAB — ECHOCARDIOGRAM COMPLETE
Area-P 1/2: 2.95 cm2
Height: 71 in
S' Lateral: 3.1 cm
Weight: 2974.4 oz

## 2022-08-15 LAB — GLUCOSE, CAPILLARY
Glucose-Capillary: 191 mg/dL — ABNORMAL HIGH (ref 70–99)
Glucose-Capillary: 272 mg/dL — ABNORMAL HIGH (ref 70–99)
Glucose-Capillary: 211 mg/dL — ABNORMAL HIGH (ref 70–99)
Glucose-Capillary: 274 mg/dL — ABNORMAL HIGH (ref 70–99)

## 2022-08-15 LAB — URINALYSIS, ROUTINE W REFLEX MICROSCOPIC
Bilirubin Urine: NEGATIVE
Glucose, UA: 150 mg/dL — AB
Hgb urine dipstick: NEGATIVE
Ketones, ur: NEGATIVE mg/dL
Leukocytes,Ua: NEGATIVE
Nitrite: NEGATIVE
Protein, ur: 30 mg/dL — AB
Specific Gravity, Urine: 1.018 (ref 1.005–1.030)
pH: 5 (ref 5.0–8.0)

## 2022-08-15 LAB — MAGNESIUM: Magnesium: 1.5 mg/dL — ABNORMAL LOW (ref 1.7–2.4)

## 2022-08-15 LAB — CK: Total CK: 51 U/L (ref 49–397)

## 2022-08-15 LAB — PHOSPHORUS: Phosphorus: 2.8 mg/dL (ref 2.5–4.6)

## 2022-08-15 MED ORDER — SODIUM CHLORIDE 0.9 % IV SOLN: INTRAVENOUS | Status: AC

## 2022-08-15 MED ORDER — FLUTICASONE PROPIONATE 50 MCG/ACT NA SUSP: 1.0000 | Freq: Two times a day (BID) | NASAL | Status: DC | PRN

## 2022-08-15 MED ORDER — METHOCARBAMOL 500 MG PO TABS
500.0000 mg | ORAL_TABLET | Freq: Three times a day (TID) | ORAL | Status: DC | PRN
  Administered 2022-08-15 – 2022-08-16 (×2): 500 mg via ORAL
  Filled 2022-08-15 (×2): qty 1

## 2022-08-15 MED ORDER — ACETAMINOPHEN 325 MG PO TABS
650.0000 mg | ORAL_TABLET | Freq: Four times a day (QID) | ORAL | Status: DC | PRN
  Administered 2022-08-16 (×2): 650 mg via ORAL
  Filled 2022-08-15 (×2): qty 2

## 2022-08-15 MED ORDER — CLOPIDOGREL BISULFATE 75 MG PO TABS
75.0000 mg | ORAL_TABLET | Freq: Every day | ORAL | Status: DC
  Administered 2022-08-15 – 2022-08-16 (×2): 75 mg via ORAL
  Filled 2022-08-15 (×2): qty 1

## 2022-08-15 MED ORDER — ACETAMINOPHEN 650 MG RE SUPP: 650.0000 mg | Freq: Four times a day (QID) | RECTAL | Status: DC | PRN

## 2022-08-15 MED ORDER — ENOXAPARIN SODIUM 40 MG/0.4ML IJ SOSY
40.0000 mg | PREFILLED_SYRINGE | INTRAMUSCULAR | Status: DC
  Administered 2022-08-15 – 2022-08-17 (×3): 40 mg via SUBCUTANEOUS
  Filled 2022-08-15 (×3): qty 0.4

## 2022-08-15 MED ORDER — NEBIVOLOL HCL 10 MG PO TABS
5.0000 mg | ORAL_TABLET | Freq: Every day | ORAL | Status: DC
  Administered 2022-08-15 – 2022-08-17 (×3): 5 mg via ORAL
  Filled 2022-08-15 (×3): qty 1

## 2022-08-15 MED ORDER — DOXAZOSIN MESYLATE 2 MG PO TABS
2.0000 mg | ORAL_TABLET | Freq: Every day | ORAL | Status: DC
  Administered 2022-08-15 – 2022-08-16 (×2): 2 mg via ORAL
  Filled 2022-08-15 (×2): qty 1

## 2022-08-15 MED ORDER — LORATADINE 10 MG PO TABS
10.0000 mg | ORAL_TABLET | Freq: Every day | ORAL | Status: DC
  Administered 2022-08-15 – 2022-08-17 (×3): 10 mg via ORAL
  Filled 2022-08-15 (×3): qty 1

## 2022-08-15 MED ORDER — ASPIRIN 81 MG PO TBEC
81.0000 mg | DELAYED_RELEASE_TABLET | Freq: Every day | ORAL | Status: DC
  Administered 2022-08-15 – 2022-08-17 (×3): 81 mg via ORAL
  Filled 2022-08-15 (×2): qty 1

## 2022-08-15 MED ORDER — ENSURE ENLIVE PO LIQD
237.0000 mL | Freq: Two times a day (BID) | ORAL | Status: DC
  Administered 2022-08-15 – 2022-08-16 (×4): 237 mL via ORAL

## 2022-08-15 MED ORDER — ONDANSETRON HCL 4 MG/2ML IJ SOLN: 4.0000 mg | Freq: Four times a day (QID) | INTRAMUSCULAR | Status: DC | PRN

## 2022-08-15 MED ORDER — ONDANSETRON HCL 4 MG PO TABS: 4.0000 mg | ORAL_TABLET | Freq: Four times a day (QID) | ORAL | Status: DC | PRN

## 2022-08-15 MED ORDER — INSULIN ASPART 100 UNIT/ML IJ SOLN
0.0000 [IU] | Freq: Three times a day (TID) | INTRAMUSCULAR | Status: DC
  Administered 2022-08-15: 2 [IU] via SUBCUTANEOUS
  Administered 2022-08-15: 3 [IU] via SUBCUTANEOUS
  Administered 2022-08-16 (×2): 2 [IU] via SUBCUTANEOUS
  Administered 2022-08-16 – 2022-08-17 (×2): 3 [IU] via SUBCUTANEOUS
  Administered 2022-08-17: 1 [IU] via SUBCUTANEOUS

## 2022-08-15 MED ORDER — PRAVASTATIN SODIUM 10 MG PO TABS
10.0000 mg | ORAL_TABLET | Freq: Every evening | ORAL | Status: DC
  Filled 2022-08-15 (×2): qty 1

## 2022-08-15 NOTE — Progress Notes (Addendum)
CRITICAL VALUE STICKER  CRITICAL VALUE: Lactic Acid 2.4  RECEIVER (on-site recipient of call): Marvell Fuller, RN   DATE & TIME NOTIFIED: 08/15/2022 at 1015  MESSENGER (representative from lab): Leota Sauers  MD NOTIFIED: Dr. Gwenlyn Perking  TIME OF NOTIFICATION: 08/15/2022 at 1016  RESPONSE:  no new orders / IVF started earlier

## 2022-08-15 NOTE — Discharge Instructions (Signed)
  Providers Accepting New Patients in [place], [place]    [facility] [address]  [place], [place] [address] [phone] Accepts most insurances  [facility] [address] [place], [place] [address] [phone] Accepts most insurances  [facility] [place] [facility] [address] [place], [place] [address]  [phone] Must meet requirements  [facility] [address]  [place], [place] [address] [phone] Accepts most insurances  [facility] [address]  [place], [place] [address] [phone] Accepts most insurances  [facility] [address]   [place], [place]   [phone] Accepts most insurances  [facility] ([facility])  [address]  [place], [place] [address] [phone] Accepts most insurances     [facility] [address]  [place], [place] [address] [phone] Accepts most insurances  [facility] [address] [place], [place] [address] [phone] option 1 Accepts Medicaid and Uninsured  [facility] [address]  [place], [place] [address] [phone] Accepts most insurances  Tesfaye Fanta, MD [address]  [place], [place] [address] [phone] Accepts most insurances  [facility] [place] [address]  [place], [place] [address] [phone] Accepts most insurances  [facility] [address] [place],  [address] [phone] Accepts most insurances  Zack Hall, MD [address] [place],  [address] [phone]  Accepts most insurances                      

## 2022-08-15 NOTE — Inpatient Diabetes Management (Signed)
Inpatient Diabetes Program Recommendations  AACE/ADA: New Consensus Statement on Inpatient Glycemic Control   Target Ranges:  Prepandial:   less than 140 mg/dL      Peak postprandial:   less than 180 mg/dL (1-2 hours)      Critically ill patients:  140 - 180 mg/dL    Latest Reference Range & Units 08/15/22 07:16  Glucose-Capillary 70 - 99 mg/dL 409 (H)    Latest Reference Range & Units 08/14/22 23:12  Glucose 70 - 99 mg/dL 63 (L)   Review of Glycemic Control  Diabetes history: DM2 Outpatient Diabetes medications: Lantus 25-40 units, Novolog 25-35 units BID, Metformin XR 1000 mg BID Current orders for Inpatient glycemic control: CBGs Q6H  Inpatient Diabetes Program Recommendations:    Insulin: Please consider changing CBGs to AC&HS and ordering Novolog 0-9 units TID with meals and Novolog 0-5 units QHS.   Thanks, Orlando Penner, RN, MSN, CDCES Diabetes Coordinator Inpatient Diabetes Program [phone] (Team Pager from 8am to 5pm)

## 2022-08-15 NOTE — Progress Notes (Signed)
Transition of Care [facility]) - Inpatient Brief Assessment   Patient Details  Name: Alex Salazar MRN: [id number] Date of Birth: 14-Apr-1945  Transition of [facility]) CM/SW Contact:    Leitha Bleak, RN Phone Number: 08/15/2022, 1:19 PM   Clinical Narrative:  Patient admitted after a fall. PT recommended HHPT. TOC waiting to hear back from patient. Also will discuss PCP. List added to AVS.    Transition of Care Asessment: Insurance and Status: (P) Insurance coverage has been reviewed Patient has primary care physician: (P) No (list added)     Prior/Current Home Services: (P) No current home services Social Determinants of Health Reivew: (P) SDOH reviewed no interventions necessary Readmission risk has been reviewed: (P) Yes Transition of care needs: (P) transition of care needs identified, TOC will continue to follow

## 2022-08-15 NOTE — Evaluation (Signed)
Physical Therapy Evaluation Patient Details Name: Alex Salazar MRN: [id number] DOB: 06-Sep-1945 Today's Date: 08/15/2022  History of Present Illness  Alex Salazar is a 77 y.o. male with medical history significant of HTN, HLD, T2DM, prior CVA emergency department due to fall sustained at home.  She complained of 3-day onset of weakness and has fallen twice.  Patient states that his legs gave out on him last night, he lost his balance and fell onto his back hitting his head without losing consciousness.  He complained of mid back pain and denies chest pain, shortness of breath, fever, chills, nausea, vomiting. Patient lives alone.   Clinical Impression  Patient functioning near baseline for functional mobility and gait demonstrating good return for using RW during gait training in hallway, had difficulty going up/down stairs due to using RW held sideways with 1 hand and holding onto side rail with other hand, patient advised to use his bilateral side rails at home with family member supervising and bringing his walker in the house for him.  Patient demonstrates good return for walking short distances in room during transfer to/from commode having to lean on walls for support.  Patient tolerated sitting up in chair after therapy.  Patient will benefit from continued skilled physical therapy in hospital and recommended venue below to increase strength, balance, endurance for safe ADLs and gait.          Recommendations for follow up therapy are one component of a multi-disciplinary discharge planning process, led by the attending physician.  Recommendations may be updated based on patient status, additional functional criteria and insurance authorization.  Follow Up Recommendations       Assistance Recommended at Discharge PRN  Patient can return home with the following  A little help with walking and/or transfers;A little help with bathing/dressing/bathroom;Help with stairs or ramp for  entrance;Assistance with cooking/housework    Equipment Recommendations None recommended by PT  Recommendations for Other Services       Functional Status Assessment Patient has had a recent decline in their functional status and demonstrates the ability to make significant improvements in function in a reasonable and predictable amount of time.     Precautions / Restrictions Precautions Precautions: Fall Restrictions Weight Bearing Restrictions: No      Mobility  Bed Mobility Overal bed mobility: Modified Independent             General bed mobility comments: poor tolerance for lying flat due to chronic low back pain, sleeps in recliner at home    Transfers Overall transfer level: Modified independent Equipment used: Rolling walker (2 wheels), None               General transfer comment: has to lean on nearby objects for support when not using an AD    Ambulation/Gait Ambulation/Gait assistance: Supervision, Modified independent (Device/Increase time) Gait Distance (Feet): 120 Feet Assistive device: Rolling walker (2 wheels) Gait Pattern/deviations: Decreased step length - right, Decreased step length - left, Decreased stride length Gait velocity: decreased     General Gait Details: slightly labored cadence without loss of balance with good return for using RW  Stairs Stairs: Yes Stairs assistance: Supervision, Min guard Stair Management: One rail Left, One rail Right, Alternating pattern, With walker Number of Stairs: 4 General stair comments: demonstrates fair  return using RW for going up/down steps in stairwell, patient advised to use side rails at home with family supervising and bringing walker in house  Wheelchair Mobility  Modified Rankin (Stroke Patients Only)       Balance Overall balance assessment: Needs assistance Sitting-balance support: Feet supported, No upper extremity supported Sitting balance-Leahy Scale: Good Sitting balance  - Comments: seated EOB   Standing balance support: During functional activity, No upper extremity supported Standing balance-Leahy Scale: Fair Standing balance comment: fair/good using RW                             Pertinent Vitals/Pain Pain Assessment Pain Assessment: 0-10 Pain Score: 6  Pain Location: back Pain Descriptors / Indicators: Sore, Discomfort Pain Intervention(s): Limited activity within patient's tolerance, Monitored during session, Repositioned    Home Living Family/patient expects to be discharged to:: Private residence Living Arrangements: Alone Available Help at Discharge: Family;Friend(s);Available PRN/intermittently Type of Home: House Home Access: Stairs to enter Entrance Stairs-Rails: Can reach both;Left;Right Entrance Stairs-Number of Steps: 4   Home Layout: One level Home Equipment: Agricultural consultant (2 wheels);Cane - quad;Cane - single point;Wheelchair - manual;Electric scooter;Grab bars - tub/shower      Prior Function Prior Level of Function : Independent/Modified Independent             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, can walk short household distances without AD ADLs Comments: Independent     Hand Dominance   Dominant Hand: Right    Extremity/Trunk Assessment   Upper Extremity Assessment Upper Extremity Assessment: Defer to OT evaluation    Lower Extremity Assessment Lower Extremity Assessment: Generalized weakness    Cervical / Trunk Assessment Cervical / Trunk Assessment: Normal  Communication   Communication: No difficulties  Cognition Arousal/Alertness: Awake/alert Behavior During Therapy: WFL for tasks assessed/performed Overall Cognitive Status: Within Functional Limits for tasks assessed                                          General Comments      Exercises     Assessment/Plan    PT Assessment Patient needs continued PT services  PT Problem List Decreased  strength;Decreased activity tolerance;Decreased balance;Decreased mobility       PT Treatment Interventions DME instruction;Gait training;Stair training;Functional mobility training;Therapeutic activities;Therapeutic exercise;Patient/family education;Balance training    PT Goals (Current goals can be found in the Care Plan section)  Acute Rehab PT Goals Patient Stated Goal: return home with family to assist PT Goal Formulation: With patient Time For Goal Achievement: 08/17/22 Potential to Achieve Goals: Good    Frequency Min 3X/week     Co-evaluation PT/OT/SLP Co-Evaluation/Treatment: Yes Reason for Co-Treatment: To address functional/ADL transfers PT goals addressed during session: Mobility/safety with mobility;Balance;Proper use of DME OT goals addressed during session: ADL's and self-care       AM-PAC PT "6 Clicks" Mobility  Outcome Measure Help needed turning from your back to your side while in a flat bed without using bedrails?: None Help needed moving from lying on your back to sitting on the side of a flat bed without using bedrails?: A Little Help needed moving to and from a bed to a chair (including a wheelchair)?: None Help needed standing up from a chair using your arms (e.g., wheelchair or bedside chair)?: None Help needed to walk in hospital room?: A Little Help needed climbing 3-5 steps with a railing? : A Little 6 Click Score: 21    End of Session   Activity Tolerance:  Patient tolerated treatment well Patient left: in chair;with call bell/phone within reach Nurse Communication: Mobility status PT Visit Diagnosis: Unsteadiness on feet (R26.81);Other abnormalities of gait and mobility (R26.89);Muscle weakness (generalized) (M62.81)    Time: 8119-1478 PT Time Calculation (min) (ACUTE ONLY): 25 min   Charges:   PT Evaluation $PT Eval Moderate Complexity: 1 Mod PT Treatments $Therapeutic Activity: 23-37 mins        11:23 AM, 08/15/22 Ocie Bob,  MPT Physical Therapist with [facility] [phone] office [phone] mobile phone

## 2022-08-15 NOTE — Progress Notes (Signed)
Patient up in chair during this shift. He stated that he did void during this shift. Patient states that his back hurts when he moves but when he is sitting still there is no pain.

## 2022-08-15 NOTE — Plan of Care (Signed)
  Problem: Acute Rehab PT Goals(only PT should resolve) Goal: Pt Will Go Supine/Side To Sit Outcome: Progressing Flowsheets (Taken 08/15/2022 1124) Pt will go Supine/Side to Sit: with modified independence Goal: Patient Will Transfer Sit To/From Stand Outcome: Progressing Flowsheets (Taken 08/15/2022 1124) Patient will transfer sit to/from stand:  with modified independence  Independently Goal: Pt Will Transfer Bed To Chair/Chair To Bed Outcome: Progressing Flowsheets (Taken 08/15/2022 1124) Pt will Transfer Bed to Chair/Chair to Bed:  with modified independence  Independently Goal: Pt Will Ambulate Outcome: Progressing Flowsheets (Taken 08/15/2022 1124) Pt will Ambulate:  > 125 feet  with modified independence  with rolling walker  with cane   11:25 AM, 08/15/22 Ocie Bob, MPT Physical Therapist with [facility] [phone] office [phone] mobile phone

## 2022-08-15 NOTE — Progress Notes (Signed)
CRITICAL VALUE STICKER  CRITICAL VALUE: Troponin 317  RECEIVER (on-site recipient of call): Maia Breslow Cates[place]  DATE & TIME NOTIFIED: 08/15/2022 at 1022  MESSENGER (representative from lab):Crystal Ambrose Mantle  MD NOTIFIED: Dr. Gwenlyn Perking  TIME OF NOTIFICATION: 08/15/2022 at 1024  RESPONSE: no new orders

## 2022-08-15 NOTE — H&P (Signed)
History and Physical    Patient: Alex Salazar:[id number] DOB: [birth year] DOA: 08/14/2022 DOS: the patient was seen and examined on 08/15/2022 PCP: Patient, No Pcp Per  Patient coming from: Home  Chief Complaint:  Chief Complaint  Patient presents with   Fall   HPI: Alex Salazar is a 77 y.o. male with medical history significant of HTN, HLD, T2DM, prior CVA emergency department due to fall sustained at home.  She complained of 3-day onset of weakness and has fallen twice.  Patient states that his legs gave out on him last night, he lost his balance and fell onto his back hitting his head without losing consciousness.  He complained of mid back pain and denies chest pain, shortness of breath, fever, chills, nausea, vomiting. Patient lives alone.  ED Course:  In the emergency department, BP on arrival was 92/59, other vital signs were within normal range.  Workup in the ED showed normal CBC except for WBC of 14.1.  BMP was normal except for blood glucose of 63, creatinine 1.74, albumin 3.4.  Troponin was elevated at 400 > 404, urinalysis was normal.  Total CK 51. Chest x-ray showed pulmonary vascular congestion without overt pulmonary edema.  No displaced rib fractures. CT head without contrast showed no acute intracranial abnormalities CT cervical spine showed no acute displaced fractures, but showed prominent degenerative changes Thoracic spine x-ray showed no acute fracture in the thoracic or lumbar spine.  Multilevel degenerative changes in the thoracolumbar spine with possible ankylosis in the mid thoracic spine. IV hydration was provided.  Hospitalist was asked to admit patient for further evaluation and management.  Review of Systems: Review of systems as noted in the HPI. All other systems reviewed and are negative.   Past Medical History:  Diagnosis Date   Diabetes mellitus without complication (HCC)    Hyperlipidemia    Hypertension    PE (pulmonary  thromboembolism) (HCC)    Past Surgical History:  Procedure Laterality Date   BYPASS GRAFT      Social History:  reports that he has been smoking cigarettes. He has been smoking an average of .5 packs per day. He has never used smokeless tobacco. He reports that he does not currently use alcohol. He reports that he does not use drugs.   Allergies  Allergen Reactions   Penicillins    Statins     History reviewed. No pertinent family history.   Prior to Admission medications   Medication Sig Start Date End Date Taking? Authorizing Provider  aspirin EC 81 MG EC tablet Take 1 tablet (81 mg total) by mouth daily with breakfast. Take for 4 weeks only, then STOP 08/06/18   Emokpae, Courage, MD  clopidogrel (PLAVIX) 75 MG tablet Take 75 mg by mouth daily.      [provider]  doxazosin (CARDURA) 4 MG tablet Take 2 mg by mouth at bedtime.     [provider]  fluticasone (FLONASE) 50 MCG/ACT nasal spray Place 1-2 sprays into both nostrils 2 (two) times daily as needed for allergies or rhinitis.    [provider]  insulin aspart (NOVOLOG) 100 UNIT/ML injection Inject 25-35 Units into the skin 2 (two) times daily as needed for high blood sugar (Patient gives himself 25-35 units for blood sugar levels over 225).    [provider]  insulin glargine (LANTUS) 100 UNIT/ML injection Inject 25-40 Units into the skin at bedtime. If blood sugars are 180, patient takes only 25 units. Prescribed 40  units nightly    [provider]  lisinopril (ZESTRIL) 20 MG tablet Take 1 tablet (20 mg total) by mouth daily. 08/06/18   Shon Hale, MD  loratadine (CLARITIN) 10 MG tablet Take 10 mg by mouth daily.    [provider]  metFORMIN (GLUCOPHAGE-XR) 500 MG 24 hr tablet Take 1,000 mg by mouth 2 (two) times a day.    [provider]  nebivolol (BYSTOLIC) 5 MG tablet Take 5 mg by mouth daily.    [provider]  pravastatin (PRAVACHOL) 10 MG  tablet Take 1 tablet (10 mg total) by mouth every evening. For stroke and heart attack prevention 08/06/18 08/06/19  Shon Hale, MD    Physical Exam: BP 138/82 (BP Location: Left Arm)   Pulse 79   Temp 97.7 F (36.5 C) (Oral)   Resp 16   Ht 5\' 11"  (1.803 m)   Wt 84.3 kg   SpO2 98%   BMI 25.93 kg/m   General: 77 y.o. year-old male well developed well nourished in no acute distress.  Alert and oriented x3. HEENT: NCAT, EOMI, dry mucous membrane Neck: Supple, trachea medial Cardiovascular: Regular rate and rhythm with no rubs or gallops.  No thyromegaly or JVD noted.  No lower extremity edema. 2/4 pulses in all 4 extremities. Respiratory: Clear to auscultation with no wheezes or rales. Good inspiratory effort. Abdomen: Soft, nontender nondistended with normal bowel sounds x4 quadrants. Muskuloskeletal: Tenderness to palpation of the thoracolumbar spine.  No cyanosis, clubbing or edema noted bilaterally Neuro: CN II-XII intact, strength 5/5 x 4, sensation, reflexes intact Skin: No ulcerative lesions noted or rashes Psychiatry: Judgement and insight appear normal. Mood is appropriate for condition and setting          Labs on Admission:  Basic Metabolic Panel: Recent Labs  Lab 08/14/22 2312  NA 137  K 3.6  CL 99  CO2 26  GLUCOSE 63*  BUN 20  CREATININE 1.74*  CALCIUM 9.6   Liver Function Tests: Recent Labs  Lab 08/14/22 2312  AST 23  ALT 19  ALKPHOS 72  BILITOT 0.6  PROT 8.3*  ALBUMIN 3.4*   No results for input(s): "LIPASE", "AMYLASE" in the last 168 hours. No results for input(s): "AMMONIA" in the last 168 hours. CBC: Recent Labs  Lab 08/14/22 2312  WBC 14.1*  NEUTROABS 9.6*  HGB 15.6  HCT 46.7  MCV 90.0  PLT 229   Cardiac Enzymes: Recent Labs  Lab 08/14/22 2312  CKTOTAL 51    BNP (last 3 results) No results for input(s): "BNP" in the last 8760 hours.  ProBNP (last 3 results) No results for input(s): "PROBNP" in the last 8760  hours.  CBG: No results for input(s): "GLUCAP" in the last 168 hours.  Radiological Exams on Admission: DG Chest Port 1 View  Result Date: 08/15/2022 CLINICAL DATA:  Overall weakness and 2 falls over the last 3 days. Mid back pain EXAM: PORTABLE CHEST 1 VIEW COMPARISON:  Report from chest radiographs 04/18/2006 FINDINGS: Normal cardiomediastinal silhouette. Sternotomy and CABG. Pulmonary vascular congestion. No focal consolidation, pleural effusion, or pneumothorax. No displaced rib fractures. IMPRESSION: 1. Pulmonary vascular congestion without overt pulmonary edema. 2. No displaced rib fractures. Electronically Signed   By: Minerva Fester M.D.   On: 08/15/2022 02:40   CT CERVICAL SPINE WO CONTRAST  Result Date: 08/15/2022 CLINICAL DATA:  Neck trauma. Overall weakness and 2 falls over the last 3 days. Back pain. EXAM: CT CERVICAL SPINE WITHOUT CONTRAST TECHNIQUE: Multidetector  CT imaging of the cervical spine was performed without intravenous contrast. Multiplanar CT image reconstructions were also generated. RADIATION DOSE REDUCTION: This exam was performed according to the departmental dose-optimization program which includes automated exposure control, adjustment of the mA and/or kV according to patient size and/or use of iterative reconstruction technique. COMPARISON:  None Available. FINDINGS: Alignment: Normal. Skull base and vertebrae: No acute fracture. No primary bone lesion or focal pathologic process. Soft tissues and spinal canal: No prevertebral fluid or swelling. No visible canal hematoma. Disc levels: Degenerative changes throughout the cervical spine with narrowed interspaces and prominent endplate osteophyte formation. Prominent osteophytes cause bone encroachment on the anterior central canal at C5-6 and C6-7 levels. Uncovertebral spurring causes some bone encroachment upon the neural foramina bilaterally. Upper chest: Motion artifact limits examination. Probable emphysematous changes  in the lung apices. Other: None. IMPRESSION: Normal alignment of the cervical spine. No acute displaced fractures identified. Prominent degenerative changes. Electronically Signed   By: Burman Nieves M.D.   On: 08/15/2022 01:00   CT HEAD WO CONTRAST ( )  Result Date: 08/15/2022 CLINICAL DATA:  Head trauma, minor. Overall weakness and 2 falls over the last 3 days. Back pain. EXAM: CT HEAD WITHOUT CONTRAST TECHNIQUE: Contiguous axial images were obtained from the base of the skull through the vertex without intravenous contrast. RADIATION DOSE REDUCTION: This exam was performed according to the departmental dose-optimization program which includes automated exposure control, adjustment of the mA and/or kV according to patient size and/or use of iterative reconstruction technique. COMPARISON:  CT head 08/05/2018.  MRI brain 08/05/2018 FINDINGS: Brain: Diffuse cerebral atrophy. Ventricular dilatation consistent with central atrophy. Low-attenuation changes in the deep white matter consistent with small vessel ischemia. Old lacunar infarct again demonstrated in the right basal ganglia. No abnormal extra-axial fluid collections. No mass effect or midline shift. Gray-white matter junctions are distinct. Basal cisterns are not effaced. No acute intracranial hemorrhage. Vascular: No hyperdense vessel or unexpected calcification. Skull: Normal. Negative for fracture or focal lesion. Sinuses/Orbits: Mucosal thickening in the paranasal sinuses. Possible acute air-fluid level in the right maxillary antrum may indicate acute sinusitis. Mastoid air cells are clear. Other: None. IMPRESSION: 1. No acute intracranial abnormalities. Chronic atrophy and small vessel ischemic changes. Old lacunar infarcts. 2. Mucosal thickening in the paranasal sinuses with air-fluid level in the right maxillary antrum, possibly acute sinusitis. Electronically Signed   By: Burman Nieves M.D.   On: 08/15/2022 00:57   DG Thoracic Spine 2  View  Result Date: 08/15/2022 CLINICAL DATA:  Fall with mid back pain. EXAM: THORACIC SPINE 2 VIEWS; LUMBAR SPINE - COMPLETE 4+ VIEW COMPARISON:  None available. FINDINGS: Thoracic spine: There is no evidence of acute thoracic spine fracture. Alignment is normal. Multilevel intervertebral disc space narrowing and degenerative endplate changes. There is questionable ankylosis in the midthoracic spine. Sternotomy wires are noted over the midline. Lumbar spine: No evidence of acute fracture in the lumbar spine. Alignment is normal. Intervertebral disc space is maintained. There is multilevel degenerative endplate changes. Facet arthropathy is noted in the lower lumbar spine. Total hip arthroplasty changes are noted on the left. IMPRESSION: 1. No acute fracture in the thoracic or lumbar spine. 2. Multilevel degenerative changes in the thoracolumbar spine with possible ankylosis in the midthoracic spine. Electronically Signed   By: Thornell Sartorius M.D.   On: 08/15/2022 00:54   DG Lumbar Spine Complete  Result Date: 08/15/2022 CLINICAL DATA:  Fall with mid back pain. EXAM: THORACIC SPINE 2 VIEWS; LUMBAR SPINE -  COMPLETE 4+ VIEW COMPARISON:  None available. FINDINGS: Thoracic spine: There is no evidence of acute thoracic spine fracture. Alignment is normal. Multilevel intervertebral disc space narrowing and degenerative endplate changes. There is questionable ankylosis in the midthoracic spine. Sternotomy wires are noted over the midline. Lumbar spine: No evidence of acute fracture in the lumbar spine. Alignment is normal. Intervertebral disc space is maintained. There is multilevel degenerative endplate changes. Facet arthropathy is noted in the lower lumbar spine. Total hip arthroplasty changes are noted on the left. IMPRESSION: 1. No acute fracture in the thoracic or lumbar spine. 2. Multilevel degenerative changes in the thoracolumbar spine with possible ankylosis in the midthoracic spine. Electronically Signed   By:  Thornell Sartorius M.D.   On: 08/15/2022 00:54    EKG: I independently viewed the EKG done and my findings are as followed: Normal sinus rhythm at a rate of 78bpm  Assessment/Plan Present on Admission: **None**  Principal Problem:   Fall at home Active Problems:   Dehydration   Lactic acidosis   Elevated troponin   Type 2 diabetes mellitus with hypoglycemia (HCC)   Leukocytosis   Hypoalbuminemia due to protein-calorie malnutrition (HCC)   AKI (acute kidney injury) (HCC)   CAD (coronary artery disease)   History of CVA (cerebrovascular accident)  Fall at home No acute fracture noted on imaging studies Back pain was due to multilevel degenerative changes in the thoracolumbar spine Continue Tylenol as needed Continue fall precaution Continue PT/OT eval and treat  Dehydration Continue IV hydration  Lactic acidosis Lactic acid 2.7, continue to trend lactic acid  Elevated troponin possibly secondary to type II demand ischemia Troponin 400 > 404, patient denies any chest pain Chest x-ray showed pulmonary vascular congestion without overt pulmonary edema Cardiologist on-call was consulted and this was thought to be due to troponin leak per EDP Continue to trend troponin Echocardiogram will be done in the morning Consider cardiology consult for worsening of symptoms Consider low-dose Lasix for pulmonary vascular congestion when BP is improved and stable  Type 2 diabetes mellitus with hypoglycemia CBG 63; soda and snacks were given Continue hypoglycemia protocol Insulin and metformin will be temporarily held at this time  Leukocytosis possibly reactive WBC of 14.1, continue to monitor WBC with morning labs  Hypoalbuminemia possibly secondary to mild protein calorie malnutrition Albumin 3.4, protein supplement will be provided  Acute kidney injury Creatinine at 1.74 (most recent labs for comparison is 0.92 which was 4 years ago) Renally adjust medications, avoid nephrotoxic  agents/dehydration/hypotension  CAD s/p CABG Continue aspirin and statin Bystolic is temporarily held at this time due to soft BP  History of CVA Continue aspirin and statin  DVT prophylaxis: Lovenox  Advance Care Planning: Full code  Consults: None  Family Communication: None at bedside  Severity of Illness: The appropriate patient status for this patient is OBSERVATION. Observation status is judged to be reasonable and necessary in order to provide the required intensity of service to ensure the patient's safety. The patient's presenting symptoms, physical exam findings, and initial radiographic and laboratory data in the context of their medical condition is felt to place them at decreased risk for further clinical deterioration. Furthermore, it is anticipated that the patient will be medically stable for discharge from the hospital within 2 midnights of admission.   Author: Frankey Shown, DO 08/15/2022 7:05 AM  For on call review [email or web address].

## 2022-08-15 NOTE — Evaluation (Signed)
Occupational Therapy Evaluation Patient Details Name: Alex Salazar MRN: [id number] DOB: 03/25/1945 Today's Date: 08/15/2022   History of Present Illness Alex Salazar is a 77 y.o. male with medical history significant of HTN, HLD, T2DM, prior CVA emergency department due to fall sustained at home.  She complained of 3-day onset of weakness and has fallen twice.  Patient states that his legs gave out on him last night, he lost his balance and fell onto his back hitting his head without losing consciousness.  He complained of mid back pain and denies chest pain, shortness of breath, fever, chills, nausea, vomiting. Patient lives alone. (per DO)   Clinical Impression   Pt appears to be at or near baseline function for mobility and ADL's. Pt used RW for mobility in the hall and bathroom today. Mod I for ADL's. Pt is not recommended for further acute OT services and will be discharged to care of nursing staff for remaining length of stay.       Recommendations for follow up therapy are one component of a multi-disciplinary discharge planning process, led by the attending physician.  Recommendations may be updated based on patient status, additional functional criteria and insurance authorization.   Assistance Recommended at Discharge PRN  Patient can return home with the following      Functional Status Assessment  Patient has not had a recent decline in their functional status  Equipment Recommendations  None recommended by OT    Recommendations for Other Services       Precautions / Restrictions Precautions Precautions: Fall Restrictions Weight Bearing Restrictions: No      Mobility Bed Mobility Overal bed mobility: Modified Independent             General bed mobility comments: Brief sidelying to sit.    Transfers Overall transfer level: Modified independent Equipment used: Rolling walker (2 wheels)                      Balance Overall balance  assessment: Needs assistance Sitting-balance support: No upper extremity supported, Feet supported Sitting balance-Leahy Scale: Good Sitting balance - Comments: seated EOB   Standing balance support: Bilateral upper extremity supported, During functional activity Standing balance-Leahy Scale: Good Standing balance comment: fair to good with RW                           ADL either performed or assessed with clinical judgement   ADL Overall ADL's : Modified independent                                       General ADL Comments: RW used for mobility. Able to doff and don sock seated at EOB.     Vision Baseline Vision/History: 1 Wears glasses Ability to See in Adequate Light: 1 Impaired Patient Visual Report: No change from baseline Vision Assessment?: No apparent visual deficits                Pertinent Vitals/Pain Pain Assessment Pain Assessment: 0-10 Pain Score: 6  Pain Location: back Pain Descriptors / Indicators: Other (Comment) ("painful") Pain Intervention(s): Limited activity within patient's tolerance, Monitored during session, Repositioned     Hand Dominance Right   Extremity/Trunk Assessment Upper Extremity Assessment Upper Extremity Assessment: Overall WFL for tasks assessed   Lower Extremity Assessment Lower Extremity Assessment: Defer to PT evaluation  Cervical / Trunk Assessment Cervical / Trunk Assessment: Normal   Communication Communication Communication: No difficulties   Cognition Arousal/Alertness: Awake/alert Behavior During Therapy: WFL for tasks assessed/performed Overall Cognitive Status: Within Functional Limits for tasks assessed                                                        Home Living Family/patient expects to be discharged to:: Private residence Living Arrangements: Alone Available Help at Discharge: Family;Friend(s);Available PRN/intermittently Type of Home:  House Home Access: Stairs to enter [employer] of Steps: 4 Entrance Stairs-Rails: Can reach both;Left;Right Home Layout: One level     Bathroom Shower/Tub: Chief Strategy Officer: Standard Bathroom Accessibility: Yes How Accessible: Accessible via wheelchair;Accessible via walker Home Equipment: Rolling Walker (2 wheels);Cane - quad;Cane - single point;Wheelchair - manual;Electric scooter;Grab bars - tub/shower          Prior Functioning/Environment Prior Level of Function : Independent/Modified Independent             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 ADLs Comments: Independent                                Co-evaluation PT/OT/SLP Co-Evaluation/Treatment: Yes Reason for Co-Treatment: To address functional/ADL transfers   OT goals addressed during session: ADL's and self-care      AM-PAC OT "6 Clicks" Daily Activity     Outcome Measure Help from another person eating meals?: None Help from another person taking care of personal grooming?: None Help from another person toileting, which includes using toliet, bedpan, or urinal?: None Help from another person bathing (including washing, rinsing, drying)?: None Help from another person to put on and taking off regular upper body clothing?: None Help from another person to put on and taking off regular lower body clothing?: None 6 Click Score: 24   End of Session Equipment Utilized During Treatment: Rolling walker (2 wheels);Gait belt  Activity Tolerance: Patient tolerated treatment well Patient left: in chair;with call bell/phone within reach  OT Visit Diagnosis: Unsteadiness on feet (R26.81);Other abnormalities of gait and mobility (R26.89);Muscle weakness (generalized) (M62.81)                Time: 1610-9604 OT Time Calculation (min): 17 min Charges:  OT General Charges $OT Visit: 1 Visit OT Evaluation $OT Eval Low Complexity: 1 Low  Annaka Cleaver  OT, MOT   Danie Chandler 08/15/2022, 9:14 AM

## 2022-08-15 NOTE — ED Notes (Signed)
Patient returned from CT, given sandwich and soda per physicians request for low blood glucose results (63). Patient eating and drinking at this time.

## 2022-08-15 NOTE — Progress Notes (Signed)
  Echocardiogram 2D Echocardiogram has been performed.  Delcie Roch 08/15/2022, 9:42 AM

## 2022-08-15 NOTE — ED Notes (Signed)
Patient repositioned for comfort offered fluids and adjusted cardiac monitoring leads.

## 2022-08-15 NOTE — Progress Notes (Signed)
Patient seen and examined; admitted after midnight secondary to mechanical fall and weakness.  Patient reports decreasing his appetite and associated back pain.  Seem to have been present for the last 3-4 days prior to admission.  Workup has demonstrated no acute fractures a consequence of his mechanical fall.  Patient was found to be dehydrated and with acute kidney injury.  Please refer to H&P written by Dr. Thomes Dinning on 08/15/2022 for further info/details on admission.  Plan: -Continue fluid resuscitation -Minimize nephrotoxic agents and follow renal function trend -Physical therapy evaluation has been requested and will follow recommendation -Continue the use of aspirin and Plavix along with statin for stroke secondary prevention.   Vassie Loll MD [phone]

## 2022-08-15 NOTE — ED Notes (Signed)
Patient unable to urinate for collection, in and out per physicians order performed, 200 ml collect, specimen sent to lab.

## 2022-08-16 DIAGNOSIS — W010XXA Fall on same level from slipping, tripping and stumbling without subsequent striking against object, initial encounter: Secondary | ICD-10-CM | POA: Diagnosis present

## 2022-08-16 DIAGNOSIS — Z794 Long term (current) use of insulin: Secondary | ICD-10-CM | POA: Diagnosis not present

## 2022-08-16 DIAGNOSIS — I2510 Atherosclerotic heart disease of native coronary artery without angina pectoris: Secondary | ICD-10-CM | POA: Diagnosis present

## 2022-08-16 DIAGNOSIS — Z951 Presence of aortocoronary bypass graft: Secondary | ICD-10-CM | POA: Diagnosis not present

## 2022-08-16 DIAGNOSIS — E860 Dehydration: Secondary | ICD-10-CM | POA: Diagnosis present

## 2022-08-16 DIAGNOSIS — M47815 Spondylosis without myelopathy or radiculopathy, thoracolumbar region: Secondary | ICD-10-CM | POA: Diagnosis present

## 2022-08-16 DIAGNOSIS — E872 Acidosis, unspecified: Secondary | ICD-10-CM | POA: Diagnosis present

## 2022-08-16 DIAGNOSIS — Z888 Allergy status to other drugs, medicaments and biological substances status: Secondary | ICD-10-CM | POA: Diagnosis not present

## 2022-08-16 DIAGNOSIS — Z86711 Personal history of pulmonary embolism: Secondary | ICD-10-CM | POA: Diagnosis not present

## 2022-08-16 DIAGNOSIS — I10 Essential (primary) hypertension: Secondary | ICD-10-CM | POA: Diagnosis present

## 2022-08-16 DIAGNOSIS — E8809 Other disorders of plasma-protein metabolism, not elsewhere classified: Secondary | ICD-10-CM | POA: Diagnosis present

## 2022-08-16 DIAGNOSIS — E11649 Type 2 diabetes mellitus with hypoglycemia without coma: Secondary | ICD-10-CM | POA: Diagnosis present

## 2022-08-16 DIAGNOSIS — Z8673 Personal history of transient ischemic attack (TIA), and cerebral infarction without residual deficits: Secondary | ICD-10-CM | POA: Diagnosis not present

## 2022-08-16 DIAGNOSIS — M6282 Rhabdomyolysis: Secondary | ICD-10-CM | POA: Diagnosis present

## 2022-08-16 DIAGNOSIS — F17210 Nicotine dependence, cigarettes, uncomplicated: Secondary | ICD-10-CM | POA: Diagnosis present

## 2022-08-16 DIAGNOSIS — Z79899 Other long term (current) drug therapy: Secondary | ICD-10-CM | POA: Diagnosis not present

## 2022-08-16 DIAGNOSIS — W19XXXD Unspecified fall, subsequent encounter: Secondary | ICD-10-CM | POA: Diagnosis not present

## 2022-08-16 DIAGNOSIS — Y92009 Unspecified place in unspecified non-institutional (private) residence as the place of occurrence of the external cause: Secondary | ICD-10-CM | POA: Diagnosis not present

## 2022-08-16 DIAGNOSIS — R296 Repeated falls: Secondary | ICD-10-CM | POA: Diagnosis present

## 2022-08-16 DIAGNOSIS — Z7902 Long term (current) use of antithrombotics/antiplatelets: Secondary | ICD-10-CM | POA: Diagnosis not present

## 2022-08-16 DIAGNOSIS — Z7984 Long term (current) use of oral hypoglycemic drugs: Secondary | ICD-10-CM | POA: Diagnosis not present

## 2022-08-16 DIAGNOSIS — N179 Acute kidney failure, unspecified: Secondary | ICD-10-CM | POA: Diagnosis present

## 2022-08-16 DIAGNOSIS — E441 Mild protein-calorie malnutrition: Secondary | ICD-10-CM | POA: Diagnosis present

## 2022-08-16 DIAGNOSIS — Z7982 Long term (current) use of aspirin: Secondary | ICD-10-CM | POA: Diagnosis not present

## 2022-08-16 DIAGNOSIS — E1165 Type 2 diabetes mellitus with hyperglycemia: Secondary | ICD-10-CM | POA: Diagnosis present

## 2022-08-16 DIAGNOSIS — D72829 Elevated white blood cell count, unspecified: Secondary | ICD-10-CM | POA: Diagnosis present

## 2022-08-16 DIAGNOSIS — E785 Hyperlipidemia, unspecified: Secondary | ICD-10-CM | POA: Diagnosis present

## 2022-08-16 LAB — CBC
HCT: 41.9 % (ref 39.0–52.0)
Hemoglobin: 13.9 g/dL (ref 13.0–17.0)
MCH: 29.8 pg (ref 26.0–34.0)
MCHC: 33.2 g/dL (ref 30.0–36.0)
MCV: 89.7 fL (ref 80.0–100.0)
Platelets: 199 10*3/uL (ref 150–400)
RBC: 4.67 MIL/uL (ref 4.22–5.81)
RDW: 14.6 % (ref 11.5–15.5)
WBC: 11.5 10*3/uL — ABNORMAL HIGH (ref 4.0–10.5)
nRBC: 0 % (ref 0.0–0.2)

## 2022-08-16 LAB — COMPREHENSIVE METABOLIC PANEL
ALT: 21 U/L (ref 0–44)
AST: 26 U/L (ref 15–41)
Albumin: 2.9 g/dL — ABNORMAL LOW (ref 3.5–5.0)
Alkaline Phosphatase: 68 U/L (ref 38–126)
Anion gap: 7 (ref 5–15)
BUN: 19 mg/dL (ref 8–23)
CO2: 27 mmol/L (ref 22–32)
Calcium: 9.2 mg/dL (ref 8.9–10.3)
Chloride: 100 mmol/L (ref 98–111)
Creatinine, Ser: 1.32 mg/dL — ABNORMAL HIGH (ref 0.61–1.24)
GFR, Estimated: 56 mL/min — ABNORMAL LOW (ref >60)
Glucose, Bld: 203 mg/dL — ABNORMAL HIGH (ref 70–99)
Potassium: 4.3 mmol/L (ref 3.5–5.1)
Sodium: 134 mmol/L — ABNORMAL LOW (ref 135–145)
Total Bilirubin: 0.7 mg/dL (ref 0.3–1.2)
Total Protein: 7.3 g/dL (ref 6.5–8.1)

## 2022-08-16 LAB — GLUCOSE, CAPILLARY
Glucose-Capillary: 195 mg/dL — ABNORMAL HIGH (ref 70–99)
Glucose-Capillary: 206 mg/dL — ABNORMAL HIGH (ref 70–99)
Glucose-Capillary: 297 mg/dL — ABNORMAL HIGH (ref 70–99)
Glucose-Capillary: 234 mg/dL — ABNORMAL HIGH (ref 70–99)
Glucose-Capillary: 261 mg/dL — ABNORMAL HIGH (ref 70–99)

## 2022-08-16 MED ORDER — SODIUM CHLORIDE 0.9 % IV SOLN: INTRAVENOUS | Status: AC

## 2022-08-16 MED ORDER — INSULIN GLARGINE-YFGN 100 UNIT/ML ~~LOC~~ SOLN
7.0000 [IU] | Freq: Every day | SUBCUTANEOUS | Status: DC
  Administered 2022-08-16 – 2022-08-17 (×2): 7 [IU] via SUBCUTANEOUS
  Filled 2022-08-16 (×3): qty 0.07

## 2022-08-16 NOTE — Progress Notes (Signed)
Pt had concerns of the difference of the hospital sliding scale of insulin that he is on here while inpatient.. he is on a more aggressive scale at home.. msg sent to attending and he stated that adding low dose long acting today.

## 2022-08-16 NOTE — Progress Notes (Signed)
Brother brought printed list to confirm his brothers home medications

## 2022-08-16 NOTE — Progress Notes (Signed)
MSG sent to attending Brother TOM LOFSTROM [phone]  is at bed side and has concerns about pt going home sated that he lives by himself and thinks that is you or other attending would press pt more about the HHPT or going to rehab the conversation would go better.

## 2022-08-16 NOTE — TOC Progression Note (Signed)
Transition of Care [facility]) - Progression Note    Patient Details  Name: Alex Salazar MRN: [id number] Date of Birth: 1945-12-22  [facility] CM/SW Contact  Leitha Bleak, RN Phone Number: 08/16/2022, 12:44 PM  Clinical Narrative:     Patient is agreeable to Home health PT. Referral sent to [facility] with [place]. MD aware to order.   Expected Discharge Plan: Home w Home Health Services Barriers to Discharge: Continued Medical Work up  Expected Discharge Plan and Services       HH Arranged: PT [facility] Agency: [facility] Date [facility] Agency Contacted: 08/16/22 Time HH Agency Contacted: 1244 Representative spoke with at [facility] Agency: Kandee Keen   Social Determinants of Health (SDOH) Interventions SDOH Screenings   Food Insecurity: No Food Insecurity (08/15/2022)  Housing: Low Risk  (08/15/2022)  Transportation Needs: No Transportation Needs (08/15/2022)  Utilities: Not At Risk (08/15/2022)  Tobacco Use: High Risk (08/14/2022)    Readmission Risk Interventions     No data to display

## 2022-08-16 NOTE — Progress Notes (Signed)
Acknowledged new orders of 0.9 %  sodium chloride infusion  at 75 ml and insulin glargine-yfgn (SEMGLEE) injection 7 Units  daily

## 2022-08-16 NOTE — Progress Notes (Signed)
Progress Note   Patient: Alex Salazar:[id number] DOB: 02/16/1946 DOA: 08/14/2022     0 DOS: the patient was seen and examined on 08/16/2022   Brief hospital admission narrative: As per H&P written by Dr. Thomes Dinning on 08/16/2022 Alex Salazar is a 77 y.o. male with medical history significant of HTN, HLD, T2DM, prior CVA emergency department due to fall sustained at home.  She complained of 3-day onset of weakness and has fallen twice.  Patient states that his legs gave out on him last night, he lost his balance and fell onto his back hitting his head without losing consciousness.  He complained of mid back pain and denies chest pain, shortness of breath, fever, chills, nausea, vomiting. Patient lives alone.   ED Course:  In the emergency department, BP on arrival was 92/59, other vital signs were within normal range.  Workup in the ED showed normal CBC except for WBC of 14.1.  BMP was normal except for blood glucose of 63, creatinine 1.74, albumin 3.4.  Troponin was elevated at 400 > 404, urinalysis was normal.  Total CK 51. Chest x-ray showed pulmonary vascular congestion without overt pulmonary edema.  No displaced rib fractures. CT head without contrast showed no acute intracranial abnormalities CT cervical spine showed no acute displaced fractures, but showed prominent degenerative changes Thoracic spine x-ray showed no acute fracture in the thoracic or lumbar spine.  Multilevel degenerative changes in the thoracolumbar spine with possible ankylosis in the mid thoracic spine. IV hydration was provided.   Assessment and Plan: 1-fall -No acute fracture appreciated on imaging studies performed. -Continue as needed analgesics and muscle relaxant medication -Physical therapy/Occupational Therapy evaluation recommending home health PT at discharge -Patient in agreement -TOC aware and orders in place.  2-lactic acidosis -2.7 at time of admission -Appears to be secondary to  dehydration -Continue fluid resuscitation and monitoring adequate hydration.  3-type 2 diabetes with hyperglycemia -Patient CBG 63 at time of admission -Patient using insulin as an outpatient -Hold oral hypoglycemic agent -Continue adjusted dose of sliding scale insulin and long-acting insulin. -Follow CBG fluctuation.  3-leukocytosis -Appears to be reactive -No signs of acute infection appreciated -Continue fluid resuscitation -Follow WBCs trend.  4-acute kidney injury -Minimize nephrotoxic agents -Continue to maintain adequate hydration/fluid resuscitation and follow renal function trend. -Avoid hypotension and the use of contrast.  5-history of coronary artery disease status post CABG -Currently no chest pain or shortness of breath reported -Resume home medication management -Patient mild elevation troponins appears to be associated with troponin leakage, mild rhabdomyolysis and acute kidney injury. -2D echo demonstrating preserved ejection fraction and no regional wall motion abnormalities. -EKG and telemetry without acute ischemic changes.  6-prior history of CVA -Continue results notification -Continue aspirin and statin for secondary prevention.  Subjective:  No chest pain, no nausea, no vomiting, no shortness of breath.  Patient reports feeling better and had no overnight events.  Expressing pain discomfort in his back.  Physical Exam: Vitals:   08/15/22 1810 08/15/22 2040 08/16/22 0335 08/16/22 1225  BP: (!) 147/66 (!) 161/63 (!) 169/68 (!) 164/66  Pulse: 70 67 68 67  Resp: 20 20 16 18   Temp: 98.2 F (36.8 C) 98.2 F (36.8 C) 98.7 F (37.1 C) 97.6 F (36.4 C)  TempSrc: Oral Oral Oral Oral  SpO2: 99% 99% 100% 98%  Weight:      Height:       General exam: Alert, awake, oriented x 3; no chest pain, no nausea, no  vomiting.  Patient afebrile. Respiratory system: Clear to auscultation. Respiratory effort normal.  Good saturation on room air.  No using accessory  muscle. Cardiovascular system:RRR. No rubs or gallop; no JVD. Gastrointestinal system: Abdomen is nondistended, soft and nontender. No organomegaly or masses felt. Normal bowel sounds heard. Central nervous system: Alert and oriented. No focal neurological deficits. Extremities: No cyanosis or clubbing. Skin: No petechiae. Psychiatry: Judgement and insight appear normal. Mood & affect appropriate.   Data Reviewed: Complete metabolic panel: Sodium 134, potassium 4.3, chloride 100, bicarb 27, glucose 203, BUN 19, creatinine 1.32 normal LFTs. CBC: White blood cells 11.5, hemoglobin 13.9 platelet count 199 K.  Family Communication: Brother at bedside.  Disposition: Status is: Observation The patient will require care spanning > 2 midnights and should be moved to inpatient because: Continue fluid resuscitation and supportive care to achieve resolution of acute kidney injury.  Planned Discharge Destination: Home with Home Health  Time spent: 35 minutes  Author: Vassie Loll, MD 08/16/2022 2:51 PM  For on call review [email or web address].

## 2022-08-16 NOTE — Inpatient Diabetes Management (Signed)
Inpatient Diabetes Program Recommendations  AACE/ADA: New Consensus Statement on Inpatient Glycemic Control (2015)  Target Ranges:  Prepandial:   less than 140 mg/dL      Peak postprandial:   less than 180 mg/dL (1-2 hours)      Critically ill patients:  140 - 180 mg/dL   Lab Results  Component Value Date   GLUCAP 206 (H) 08/16/2022   HGBA1C 8.1 (H) 08/05/2018    Review of Glycemic Control  Latest Reference Range & Units 08/15/22 07:16 08/15/22 11:04 08/15/22 16:11 08/15/22 20:46 08/16/22 07:20 08/16/22 08:25  Glucose-Capillary 70 - 99 mg/dL 098 (H) 119 (H) 147 (H) 274 (H) 195 (H) 206 (H)  (H): Data is abnormally high  Diabetes history: DM2 Outpatient Diabetes medications: Lantus 25-40 units, Novolog 25-35 units BID, Metformin XR 1000 mg BID Current orders for Inpatient glycemic control: CBGs Q6H  Diabetes history: DM2 Outpatient Diabetes medications: Lantus 25-40 units, Novolog 25-35 units BID, Metformin XR 1000 mg BID Current orders for Inpatient glycemic control: Novolog 0-6 units TID  Inpatient Diabetes Program Recommendations:    Might consider:  Semglee 14 units QD (84.3 kg x 0.175 units)  Will continue to follow while inpatient.  Thank you, Dulce Sellar, MSN, CDCES Diabetes Coordinator Inpatient Diabetes Program [phone] (team pager from 8a-5p)

## 2022-08-17 DIAGNOSIS — R531 Weakness: Secondary | ICD-10-CM

## 2022-08-17 DIAGNOSIS — E860 Dehydration: Secondary | ICD-10-CM | POA: Diagnosis not present

## 2022-08-17 DIAGNOSIS — D72829 Elevated white blood cell count, unspecified: Secondary | ICD-10-CM | POA: Diagnosis not present

## 2022-08-17 DIAGNOSIS — W19XXXD Unspecified fall, subsequent encounter: Secondary | ICD-10-CM | POA: Diagnosis not present

## 2022-08-17 DIAGNOSIS — E11649 Type 2 diabetes mellitus with hypoglycemia without coma: Secondary | ICD-10-CM | POA: Diagnosis not present

## 2022-08-17 LAB — GLUCOSE, CAPILLARY
Glucose-Capillary: 206 mg/dL — ABNORMAL HIGH (ref 70–99)
Glucose-Capillary: 190 mg/dL — ABNORMAL HIGH (ref 70–99)
Glucose-Capillary: 276 mg/dL — ABNORMAL HIGH (ref 70–99)

## 2022-08-17 LAB — BASIC METABOLIC PANEL
Anion gap: 8 (ref 5–15)
BUN: 24 mg/dL — ABNORMAL HIGH (ref 8–23)
CO2: 27 mmol/L (ref 22–32)
Calcium: 9.4 mg/dL (ref 8.9–10.3)
Chloride: 99 mmol/L (ref 98–111)
Creatinine, Ser: 1.28 mg/dL — ABNORMAL HIGH (ref 0.61–1.24)
GFR, Estimated: 58 mL/min — ABNORMAL LOW (ref >60)
Glucose, Bld: 226 mg/dL — ABNORMAL HIGH (ref 70–99)
Potassium: 5.2 mmol/L — ABNORMAL HIGH (ref 3.5–5.1)
Sodium: 134 mmol/L — ABNORMAL LOW (ref 135–145)

## 2022-08-17 MED ORDER — LORATADINE 10 MG PO TABS: 10.0000 mg | ORAL_TABLET | Freq: Every day | ORAL | Status: AC

## 2022-08-17 MED ORDER — ENSURE ENLIVE PO LIQD: 237.0000 mL | Freq: Two times a day (BID) | ORAL | 12 refills | Status: AC

## 2022-08-17 MED ORDER — METHOCARBAMOL 500 MG PO TABS: 500.0000 mg | ORAL_TABLET | Freq: Three times a day (TID) | ORAL | 0 refills | Status: AC | PRN

## 2022-08-17 MED ORDER — LISINOPRIL 20 MG PO TABS: 10.0000 mg | ORAL_TABLET | Freq: Every day | ORAL | Status: AC

## 2022-08-17 MED ORDER — FLUTICASONE PROPIONATE 50 MCG/ACT NA SUSP: 1.0000 | Freq: Two times a day (BID) | NASAL | Status: AC | PRN

## 2022-08-17 NOTE — Discharge Summary (Signed)
Physician Discharge Summary   Patient: Alex Salazar MRN: [id number] DOB: May 23, 1945  Admit date:     08/14/2022  Discharge date: 08/17/22  Discharge Physician: Vassie Loll   PCP: Patient, No Pcp Per   Recommendations at discharge:  Repeat basic metabolic panel to follow electrolytes and renal function Reassess blood pressure and adjust antihypertensive regimen as needed Close monitoring of patient's CBGs/A1c with further adjustment to hypoglycemia regimen as required.   Discharge Diagnoses: Principal Problem:   Fall at home Active Problems:   Dehydration   Lactic acidosis   Elevated troponin   Type 2 diabetes mellitus with hypoglycemia (HCC)   Leukocytosis   Hypoalbuminemia due to protein-calorie malnutrition (HCC)   AKI (acute kidney injury) (HCC)   CAD (coronary artery disease)   History of CVA (cerebrovascular accident)   Weakness  Brief hospital admission narrative: As per H&P written by Dr. Thomes Dinning on 08/16/2022 Alex Salazar is a 77 y.o. male with medical history significant of HTN, HLD, T2DM, prior CVA emergency department due to fall sustained at home.  She complained of 3-day onset of weakness and has fallen twice.  Patient states that his legs gave out on him last night, he lost his balance and fell onto his back hitting his head without losing consciousness.  He complained of mid back pain and denies chest pain, shortness of breath, fever, chills, nausea, vomiting. Patient lives alone.   ED Course:  In the emergency department, BP on arrival was 92/59, other vital signs were within normal range.  Workup in the ED showed normal CBC except for WBC of 14.1.  BMP was normal except for blood glucose of 63, creatinine 1.74, albumin 3.4.  Troponin was elevated at 400 > 404, urinalysis was normal.  Total CK 51. Chest x-ray showed pulmonary vascular congestion without overt pulmonary edema.  No displaced rib fractures. CT head without contrast showed no acute  intracranial abnormalities CT cervical spine showed no acute displaced fractures, but showed prominent degenerative changes Thoracic spine x-ray showed no acute fracture in the thoracic or lumbar spine.  Multilevel degenerative changes in the thoracolumbar spine with possible ankylosis in the mid thoracic spine. IV hydration was provided.   Assessment and Plan: 1-fall -No acute fracture appreciated on imaging studies performed. -Continue as needed analgesics and muscle relaxant medication -Physical therapy/Occupational Therapy evaluation recommending home health PT at discharge -Patient in agreement. -TOC has assisted to secure outpatient home health services.   2-lactic acidosis -2.7 at time of admission -Appears to be secondary to dehydration -Resolved after fluid resuscitation. -Patient advised to maintain adequate hydration.   3-type 2 diabetes with hypoglycemia at time of admission. -Patient CBG 63 at time of admission -Patient using insulin as an outpatient -After fluid resuscitation and consistent oral intake provided patient's CBGs is stabilized and demonstrated hyperglycemia. -Resume home oral hypoglycemic agents and closely follow CBGs fluctuation in A1c to further determine management as an outpatient.   4-leukocytosis -Appears to be reactive -No signs of acute infection appreciated -WBC within normal limits at discharge. -Continue to follow trend/stability with repeat CBC at follow-up visit.   5-acute kidney injury -Continue to minimize nephrotoxic agents -Continue to maintain adequate hydration and closely follow renal function trend as an outpatient -After fluid resuscitation provided patient's renal function back to his normal. -Based on chart review and previous GFR evaluation patient may have a component of stage IIIa chronic renal failure associated most likely due to diabetes.   6-history of coronary artery disease status  post CABG -Currently no chest pain or  shortness of breath reported -Resume home medication management and continue outpatient follow-up with cardiology service. -Patient mild elevation troponins appears to be associated with troponin leakage, mild rhabdomyolysis and acute kidney injury. -2D echo demonstrating preserved ejection fraction and no regional wall motion abnormalities. -EKG and telemetry without acute ischemic changes.   7-prior history of CVA -Continue results notification -Continue aspirin and Plavix for secondary prevention. -Patient reported no a statin usage secondary to side effects in the past. -No new focal deficits currently appreciated.  Consultants: None Procedures performed: See below for x-ray reports. Disposition: Home with home health services. Diet recommendation: Heart healthy modified carbohydrate diet.  DISCHARGE MEDICATION: Allergies as of 08/17/2022       Reactions   Penicillins Anaphylaxis   Statins    Dizziness, inability to walk, lethargic         Medication List     TAKE these medications    aspirin EC 81 MG tablet Take 1 tablet (81 mg total) by mouth daily with breakfast. Take for 4 weeks only, then STOP What changed: additional instructions   cetirizine 10 MG tablet Commonly known as: ZYRTEC Take 10 mg by mouth daily.   clopidogrel 75 MG tablet Commonly known as: PLAVIX Take 75 mg by mouth daily.   doxazosin 4 MG tablet Commonly known as: CARDURA Take 2 mg by mouth at bedtime.   feeding supplement Liqd Take 237 mLs by mouth 2 (two) times daily between meals. Start taking on: August 18, 2022   fluticasone 50 MCG/ACT nasal spray Commonly known as: FLONASE Place 1-2 sprays into both nostrils 2 (two) times daily as needed for allergies or rhinitis.   glipiZIDE 5 MG tablet Commonly known as: GLUCOTROL Take 10 tablets by mouth 2 (two) times daily.   insulin aspart 100 UNIT/ML injection Commonly known as: novoLOG Inject 25-35 Units into the skin 2 (two) times daily  as needed for high blood sugar (Patient gives himself 25-35 units for blood sugar levels over 225). 200=10units 225-25-35units  Taking before he eats based on sugar and carb she plans to eat   insulin glargine-yfgn 100 UNIT/ML injection Commonly known as: SEMGLEE Inject into the skin at bedtime. 100> 0 units 200> 20units   lisinopril 20 MG tablet Commonly known as: ZESTRIL Take 0.5 tablets (10 mg total) by mouth daily.   loratadine 10 MG tablet Commonly known as: CLARITIN Take 1 tablet (10 mg total) by mouth daily.   metFORMIN 500 MG 24 hr tablet Commonly known as: GLUCOPHAGE-XR Take 1,000 mg by mouth 2 (two) times a day.   methocarbamol 500 MG tablet Commonly known as: ROBAXIN Take 1 tablet (500 mg total) by mouth every 8 (eight) hours as needed for muscle spasms.   nebivolol 5 MG tablet Commonly known as: BYSTOLIC Take 5 mg by mouth daily.        Follow-up Information     [facility] Follow up.   Specialty: Home Health Services Why: PT will call to schedule your first visit. Contact information: [address] [place] [address] [phone]                Discharge Exam: Filed Weights   08/14/22 2235 08/15/22 0122 08/15/22 0622  Weight: 82 kg 83.9 kg 84.3 kg   General exam: Alert, awake, oriented x 3; no chest pain, no nausea, no vomiting.  Patient afebrile.  Reports feeling stronger and is stable to go home. Respiratory  system: Clear to auscultation. Respiratory effort normal.  Good saturation on room air.  No using accessory muscle. Cardiovascular system:RRR. No rubs or gallop; no JVD. Gastrointestinal system: Abdomen is nondistended, soft and nontender. No organomegaly or masses felt. Normal bowel sounds heard. Central nervous system: Alert and oriented. No focal neurological deficits. Extremities: No cyanosis or clubbing. Skin: No petechiae. Psychiatry: Judgement and insight appear normal. Mood & affect appropriate.    Condition at discharge: Stable and improved.  The results of significant diagnostics from this hospitalization (including imaging, microbiology, ancillary and laboratory) are listed below for reference.   Imaging Studies: ECHOCARDIOGRAM COMPLETE  Result Date: 08/15/2022    ECHOCARDIOGRAM REPORT   Patient Name:   JADAVEON BECHLER [facility] Date of Exam: 08/15/2022 Medical Rec #:  [id number]          Height:       71.0 in Accession #:    [id number]         Weight:       185.9 lb Date of Birth:  [birth year]          BSA:          2.044 m Patient Age:    76 years           BP:           138/82 mmHg Patient Gender: M                  HR:           74 bpm. Exam Location:  Inpatient Procedure: 2D Echo, Cardiac Doppler and Color Doppler Indications:    elevated troponin  History:        Patient has prior history of Echocardiogram examinations, most                 recent 07/17/2018. CAD, Prior CABG; Risk Factors:Diabetes, Current                 Smoker, Hypertension and Dyslipidemia.  Sonographer:    Delcie Roch RDCS Referring Phys: [id number] OLADAPO ADEFESO IMPRESSIONS  1. Left ventricular ejection fraction, by estimation, is 60 to 65%. The left ventricle has normal function. The left ventricle has no regional wall motion abnormalities. Left ventricular diastolic parameters are consistent with Grade I diastolic dysfunction (impaired relaxation).  2. Right ventricular systolic function is normal. The right ventricular size is normal. Tricuspid regurgitation signal is inadequate for assessing PA pressure.  3. The mitral valve is normal in structure. No evidence of mitral valve regurgitation. No evidence of mitral stenosis.  4. The aortic valve is tricuspid. There is mild calcification of the aortic valve. Aortic valve regurgitation is not visualized. No aortic stenosis is present.  5. Aortic dilatation noted. There is borderline dilatation of the aortic root, measuring 39 mm. Normal ascending aorta.  6. The inferior vena  cava is normal in size with greater than 50% respiratory variability, suggesting right atrial pressure of 3 mmHg. FINDINGS  Left Ventricle: Left ventricular ejection fraction, by estimation, is 60 to 65%. The left ventricle has normal function. The left ventricle has no regional wall motion abnormalities. The left ventricular internal cavity size was normal in size. There is  no left ventricular hypertrophy. Left ventricular diastolic parameters are consistent with Grade I diastolic dysfunction (impaired relaxation). Right Ventricle: The right ventricular size is normal. No increase in right ventricular wall thickness. Right ventricular systolic function is normal. Tricuspid regurgitation signal is inadequate for assessing PA pressure. Left Atrium: Left  atrial size was normal in size. Right Atrium: Right atrial size was normal in size. Pericardium: There is no evidence of pericardial effusion. Mitral Valve: The mitral valve is normal in structure. No evidence of mitral valve regurgitation. No evidence of mitral valve stenosis. Tricuspid Valve: The tricuspid valve is normal in structure. Tricuspid valve regurgitation is not demonstrated. No evidence of tricuspid stenosis. Aortic Valve: The aortic valve is tricuspid. There is mild calcification of the aortic valve. Aortic valve regurgitation is not visualized. No aortic stenosis is present. Pulmonic Valve: The pulmonic valve was normal in structure. Pulmonic valve regurgitation is trivial. No evidence of pulmonic stenosis. Aorta: Aortic dilatation noted. There is borderline dilatation of the aortic root, measuring 39 mm. Venous: The inferior vena cava is normal in size with greater than 50% respiratory variability, suggesting right atrial pressure of 3 mmHg. IAS/Shunts: The interatrial septum was not well visualized.  LEFT VENTRICLE PLAX 2D LVIDd:         4.90 cm   Diastology LVIDs:         3.10 cm   LV e' medial:    6.64 cm/s LV PW:         1.10 cm   LV E/e' medial:   8.6 LV IVS:        1.10 cm   LV e' lateral:   8.49 cm/s LVOT diam:     2.10 cm   LV E/e' lateral: 6.7 LV SV:         80 LV SV Index:   39 LVOT Area:     3.46 cm  RIGHT VENTRICLE             IVC RV S prime:     12.20 cm/s  IVC diam: 1.60 cm TAPSE (M-mode): 0.9 cm LEFT ATRIUM             Index        RIGHT ATRIUM           Index LA diam:        4.60 cm 2.25 cm/m   RA Area:     15.00 cm LA Vol (A2C):   61.7 ml 30.19 ml/m  RA Volume:   34.60 ml  16.93 ml/m LA Vol (A4C):   51.0 ml 24.95 ml/m LA Biplane Vol: 57.2 ml 27.98 ml/m  AORTIC VALVE LVOT Vmax:   111.00 cm/s LVOT Vmean:  78.300 cm/s LVOT VTI:    0.231 m  AORTA Ao Root diam: 3.90 cm MITRAL VALVE MV Area (PHT): 2.95 cm    SHUNTS MV Decel Time: 257 msec    Systemic VTI:  0.23 m MV E velocity: 57.00 cm/s  Systemic Diam: 2.10 cm MV A velocity: 84.80 cm/s MV E/A ratio:  0.67 Vishnu Priya Mallipeddi Electronically signed by Winfield Rast Mallipeddi Signature Date/Time: 08/15/2022/4:10:58 PM    Final    DG Chest Port 1 View  Result Date: 08/15/2022 CLINICAL DATA:  Overall weakness and 2 falls over the last 3 days. Mid back pain EXAM: PORTABLE CHEST 1 VIEW COMPARISON:  Report from chest radiographs 04/18/2006 FINDINGS: Normal cardiomediastinal silhouette. Sternotomy and CABG. Pulmonary vascular congestion. No focal consolidation, pleural effusion, or pneumothorax. No displaced rib fractures. IMPRESSION: 1. Pulmonary vascular congestion without overt pulmonary edema. 2. No displaced rib fractures. Electronically Signed   By: Minerva Fester M.D.   On: 08/15/2022 02:40   CT CERVICAL SPINE WO CONTRAST  Result Date: 08/15/2022 CLINICAL DATA:  Neck trauma. Overall weakness and 2 falls over  the last 3 days. Back pain. EXAM: CT CERVICAL SPINE WITHOUT CONTRAST TECHNIQUE: Multidetector CT imaging of the cervical spine was performed without intravenous contrast. Multiplanar CT image reconstructions were also generated. RADIATION DOSE REDUCTION: This exam was performed  according to the departmental dose-optimization program which includes automated exposure control, adjustment of the mA and/or kV according to patient size and/or use of iterative reconstruction technique. COMPARISON:  None Available. FINDINGS: Alignment: Normal. Skull base and vertebrae: No acute fracture. No primary bone lesion or focal pathologic process. Soft tissues and spinal canal: No prevertebral fluid or swelling. No visible canal hematoma. Disc levels: Degenerative changes throughout the cervical spine with narrowed interspaces and prominent endplate osteophyte formation. Prominent osteophytes cause bone encroachment on the anterior central canal at C5-6 and C6-7 levels. Uncovertebral spurring causes some bone encroachment upon the neural foramina bilaterally. Upper chest: Motion artifact limits examination. Probable emphysematous changes in the lung apices. Other: None. IMPRESSION: Normal alignment of the cervical spine. No acute displaced fractures identified. Prominent degenerative changes. Electronically Signed   By: Burman Nieves M.D.   On: 08/15/2022 01:00   CT HEAD WO CONTRAST ( )  Result Date: 08/15/2022 CLINICAL DATA:  Head trauma, minor. Overall weakness and 2 falls over the last 3 days. Back pain. EXAM: CT HEAD WITHOUT CONTRAST TECHNIQUE: Contiguous axial images were obtained from the base of the skull through the vertex without intravenous contrast. RADIATION DOSE REDUCTION: This exam was performed according to the departmental dose-optimization program which includes automated exposure control, adjustment of the mA and/or kV according to patient size and/or use of iterative reconstruction technique. COMPARISON:  CT head 08/05/2018.  MRI brain 08/05/2018 FINDINGS: Brain: Diffuse cerebral atrophy. Ventricular dilatation consistent with central atrophy. Low-attenuation changes in the deep white matter consistent with small vessel ischemia. Old lacunar infarct again demonstrated in the  right basal ganglia. No abnormal extra-axial fluid collections. No mass effect or midline shift. Gray-white matter junctions are distinct. Basal cisterns are not effaced. No acute intracranial hemorrhage. Vascular: No hyperdense vessel or unexpected calcification. Skull: Normal. Negative for fracture or focal lesion. Sinuses/Orbits: Mucosal thickening in the paranasal sinuses. Possible acute air-fluid level in the right maxillary antrum may indicate acute sinusitis. Mastoid air cells are clear. Other: None. IMPRESSION: 1. No acute intracranial abnormalities. Chronic atrophy and small vessel ischemic changes. Old lacunar infarcts. 2. Mucosal thickening in the paranasal sinuses with air-fluid level in the right maxillary antrum, possibly acute sinusitis. Electronically Signed   By: Burman Nieves M.D.   On: 08/15/2022 00:57   DG Thoracic Spine 2 View  Result Date: 08/15/2022 CLINICAL DATA:  Fall with mid back pain. EXAM: THORACIC SPINE 2 VIEWS; LUMBAR SPINE - COMPLETE 4+ VIEW COMPARISON:  None available. FINDINGS: Thoracic spine: There is no evidence of acute thoracic spine fracture. Alignment is normal. Multilevel intervertebral disc space narrowing and degenerative endplate changes. There is questionable ankylosis in the midthoracic spine. Sternotomy wires are noted over the midline. Lumbar spine: No evidence of acute fracture in the lumbar spine. Alignment is normal. Intervertebral disc space is maintained. There is multilevel degenerative endplate changes. Facet arthropathy is noted in the lower lumbar spine. Total hip arthroplasty changes are noted on the left. IMPRESSION: 1. No acute fracture in the thoracic or lumbar spine. 2. Multilevel degenerative changes in the thoracolumbar spine with possible ankylosis in the midthoracic spine. Electronically Signed   By: Thornell Sartorius M.D.   On: 08/15/2022 00:54   DG Lumbar Spine Complete  Result Date: 08/15/2022 CLINICAL  DATA:  Fall with mid back pain. EXAM:  THORACIC SPINE 2 VIEWS; LUMBAR SPINE - COMPLETE 4+ VIEW COMPARISON:  None available. FINDINGS: Thoracic spine: There is no evidence of acute thoracic spine fracture. Alignment is normal. Multilevel intervertebral disc space narrowing and degenerative endplate changes. There is questionable ankylosis in the midthoracic spine. Sternotomy wires are noted over the midline. Lumbar spine: No evidence of acute fracture in the lumbar spine. Alignment is normal. Intervertebral disc space is maintained. There is multilevel degenerative endplate changes. Facet arthropathy is noted in the lower lumbar spine. Total hip arthroplasty changes are noted on the left. IMPRESSION: 1. No acute fracture in the thoracic or lumbar spine. 2. Multilevel degenerative changes in the thoracolumbar spine with possible ankylosis in the midthoracic spine. Electronically Signed   By: Thornell Sartorius M.D.   On: 08/15/2022 00:54    Microbiology: Results for orders placed or performed during the hospital encounter of 08/05/18  SARS Coronavirus 2 (CEPHEID - Performed in [facility] hospital lab), Hosp Order     Status: None   Collection Time: 08/05/18 12:14 PM   Specimen: Nasopharyngeal Swab  Result Value Ref Range Status   SARS Coronavirus 2 NEGATIVE NEGATIVE Final    Comment: (NOTE) If result is NEGATIVE SARS-CoV-2 target nucleic acids are NOT DETECTED. The SARS-CoV-2 RNA is generally detectable in upper and lower  respiratory specimens during the acute phase of infection. The lowest  concentration of SARS-CoV-2 viral copies this assay can detect is 250  copies / mL. A negative result does not preclude SARS-CoV-2 infection  and should not be used as the sole basis for treatment or other  patient management decisions.  A negative result may occur with  improper specimen collection / handling, submission of specimen other  than nasopharyngeal swab, presence of viral mutation(s) within the  areas targeted by this assay, and inadequate  number of viral copies  (<250 copies / mL). A negative result must be combined with clinical  observations, patient history, and epidemiological information. If result is POSITIVE SARS-CoV-2 target nucleic acids are DETECTED. The SARS-CoV-2 RNA is generally detectable in upper and lower  respiratory specimens dur ing the acute phase of infection.  Positive  results are indicative of active infection with SARS-CoV-2.  Clinical  correlation with patient history and other diagnostic information is  necessary to determine patient infection status.  Positive results do  not rule out bacterial infection or co-infection with other viruses. If result is PRESUMPTIVE POSTIVE SARS-CoV-2 nucleic acids MAY BE PRESENT.   A presumptive positive result was obtained on the submitted specimen  and confirmed on repeat testing.  While 2019 novel coronavirus  (SARS-CoV-2) nucleic acids may be present in the submitted sample  additional confirmatory testing may be necessary for epidemiological  and / or clinical management purposes  to differentiate between  SARS-CoV-2 and other Sarbecovirus currently known to infect humans.  If clinically indicated additional testing with an alternate test  methodology [phone]) is advised. The SARS-CoV-2 RNA is generally  detectable in upper and lower respiratory sp ecimens during the acute  phase of infection. The expected result is Negative. Fact Sheet for Patients:  BoilerBrush.com.cy Fact Sheet for Healthcare Providers: https://pope.com/ This test is not yet approved or cleared by the [place] FDA and has been authorized for detection and/or diagnosis of SARS-CoV-2 by FDA under an Emergency Use Authorization (EUA).  This EUA will remain in effect (meaning this test can be used) for the duration of the COVID-19 declaration  under Section 564(b)(1) of the Act, 21 U.S.C. section 360bbb-3(b)(1), unless the authorization  is terminated or revoked sooner. Performed at [facility], [address] [place], [place] [address]     Labs: CBC: Recent Labs  Lab 08/14/22 2312 08/16/22 0431  WBC 14.1* 11.5*  NEUTROABS 9.6*  --   HGB 15.6 13.9  HCT 46.7 41.9  MCV 90.0 89.7  PLT 229 199   Basic Metabolic Panel: Recent Labs  Lab 08/14/22 2312 08/15/22 0700 08/16/22 0431 08/17/22 0402  NA 137  --  134* 134*  K 3.6  --  4.3 5.2*  CL 99  --  100 99  CO2 26  --  27 27  GLUCOSE 63*  --  203* 226*  BUN 20  --  19 24*  CREATININE 1.74*  --  1.32* 1.28*  CALCIUM 9.6  --  9.2 9.4  MG  --  1.5*  --   --   PHOS  --  2.8  --   --    Liver Function Tests: Recent Labs  Lab 08/14/22 2312 08/16/22 0431  AST 23 26  ALT 19 21  ALKPHOS 72 68  BILITOT 0.6 0.7  PROT 8.3* 7.3  ALBUMIN 3.4* 2.9*   CBG: Recent Labs  Lab 08/16/22 1629 08/16/22 2112 08/17/22 0602 08/17/22 0719 08/17/22 1110  GLUCAP 234* 261* 206* 190* 276*    Discharge time spent: greater than 30 minutes.  Signed: Vassie Loll, MD [facility] 08/17/2022

## 2022-08-17 NOTE — TOC Transition Note (Addendum)
Transition of Care [facility]) - CM/SW Discharge Note   Patient Details  Name: Alex Salazar MRN: [id number] Date of Birth: 1945-08-04  Transition of Care [facility]) CM/SW Contact:  Catalina Gravel, LCSW Phone Number: 08/17/2022, 2:57 PM   Clinical Narrative:    Chilton Si, screened. [facility] Furbish accepted for HHPT. CSW contacted Denyse Amass at [place] to advise of DC today. Frances Furbish has been added to DC information. No further TOC needs.   Final next level of care: Home w Home Health Services Barriers to Discharge: No Barriers Identified   Patient Goals and CMS Choice CMS Medicare.gov Compare Post Acute Care list provided to:: Patient Choice offered to / list presented to : Patient  Discharge Placement                         Discharge Plan and Services Additional resources added to the After Visit Summary for                            [facility] Arranged: PT HH Agency: [facility] Date [facility] Agency Contacted: 08/16/22 Time HH Agency Contacted: 1244 Representative spoke with at [facility] Agency: Kandee Keen  Social Determinants of Health (SDOH) Interventions SDOH Screenings   Food Insecurity: No Food Insecurity (08/15/2022)  Housing: Low Risk  (08/15/2022)  Transportation Needs: No Transportation Needs (08/15/2022)  Utilities: Not At Risk (08/15/2022)  Tobacco Use: High Risk (08/14/2022)     Readmission Risk Interventions     No data to display

## 2022-08-18 LAB — HEMOGLOBIN A1C
Hgb A1c MFr Bld: 7.8 % — ABNORMAL HIGH (ref 4.8–5.6)
Mean Plasma Glucose: 177.16 mg/dL
